# Patient Record
Sex: Female | Born: 1980 | Race: White | Hispanic: Yes | Marital: Married | Smoking: Never smoker
Health system: Southern US, Community
[De-identification: ages and names within clinical notes are randomized; demographics above are authoritative.]

## PROBLEM LIST (undated history)

## (undated) DIAGNOSIS — D696 Thrombocytopenia, unspecified: Secondary | ICD-10-CM

## (undated) DIAGNOSIS — F419 Anxiety disorder, unspecified: Secondary | ICD-10-CM

## (undated) DIAGNOSIS — M549 Dorsalgia, unspecified: Secondary | ICD-10-CM

## (undated) DIAGNOSIS — E785 Hyperlipidemia, unspecified: Secondary | ICD-10-CM

## (undated) HISTORY — DX: Hyperlipidemia, unspecified: E78.5

## (undated) HISTORY — DX: Dorsalgia, unspecified: M54.9

## (undated) HISTORY — DX: Anxiety disorder, unspecified: F41.9

## (undated) HISTORY — DX: Thrombocytopenia, unspecified: D69.6

---

## 2012-04-18 ENCOUNTER — Encounter (HOSPITAL_COMMUNITY): Payer: Self-pay | Admitting: *Deleted

## 2012-04-18 ENCOUNTER — Emergency Department (HOSPITAL_COMMUNITY)
Admission: EM | Admit: 2012-04-18 | Discharge: 2012-04-18 | Disposition: A | Discharge status: Home / Self Care | Payer: No Typology Code available for payment source | Attending: Emergency Medicine | Admitting: Emergency Medicine

## 2012-04-18 DIAGNOSIS — S0993XA Unspecified injury of face, initial encounter: Secondary | ICD-10-CM | POA: Insufficient documentation

## 2012-04-18 DIAGNOSIS — S79919A Unspecified injury of unspecified hip, initial encounter: Secondary | ICD-10-CM | POA: Insufficient documentation

## 2012-04-18 DIAGNOSIS — Y9389 Activity, other specified: Secondary | ICD-10-CM | POA: Insufficient documentation

## 2012-04-18 DIAGNOSIS — IMO0002 Reserved for concepts with insufficient information to code with codable children: Secondary | ICD-10-CM | POA: Insufficient documentation

## 2012-04-18 DIAGNOSIS — Y92410 Unspecified street and highway as the place of occurrence of the external cause: Secondary | ICD-10-CM | POA: Insufficient documentation

## 2012-04-18 DIAGNOSIS — S79929A Unspecified injury of unspecified thigh, initial encounter: Secondary | ICD-10-CM | POA: Insufficient documentation

## 2012-04-18 MED ORDER — MELOXICAM 15 MG PO TABS: 15.0000 mg | ORAL_TABLET | Freq: Every day | ORAL | Status: DC

## 2012-04-18 MED ORDER — ACETAMINOPHEN 500 MG PO TABS
1000.0000 mg | ORAL_TABLET | Freq: Once | ORAL | Status: AC
  Administered 2012-04-18: 1000 mg via ORAL
  Filled 2012-04-18: qty 2
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
## 2012-04-18 NOTE — ED Notes (Signed)
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
## 2012-04-19 NOTE — ED Provider Notes (Signed)
Medical screening examination/treatment/procedure(s) were performed by non-physician practitioner and as supervising physician I was immediately available for consultation/collaboration.   Novice Vrba III, MD 04/19/12 0113 

## 2013-06-04 ENCOUNTER — Emergency Department (INDEPENDENT_AMBULATORY_CARE_PROVIDER_SITE_OTHER): Payer: Self-pay

## 2013-06-04 ENCOUNTER — Emergency Department (INDEPENDENT_AMBULATORY_CARE_PROVIDER_SITE_OTHER)
Admission: EM | Admit: 2013-06-04 | Discharge: 2013-06-04 | Disposition: A | Discharge status: Home / Self Care | Payer: Self-pay | Source: Home / Self Care

## 2013-06-04 ENCOUNTER — Encounter (HOSPITAL_COMMUNITY): Payer: Self-pay | Admitting: Emergency Medicine

## 2013-06-04 DIAGNOSIS — W19XXXA Unspecified fall, initial encounter: Secondary | ICD-10-CM

## 2013-06-04 DIAGNOSIS — Y92009 Unspecified place in unspecified non-institutional (private) residence as the place of occurrence of the external cause: Secondary | ICD-10-CM

## 2013-06-04 DIAGNOSIS — S2239XA Fracture of one rib, unspecified side, initial encounter for closed fracture: Secondary | ICD-10-CM

## 2013-06-04 DIAGNOSIS — S20219A Contusion of unspecified front wall of thorax, initial encounter: Secondary | ICD-10-CM

## 2013-06-04 DIAGNOSIS — S2231XA Fracture of one rib, right side, initial encounter for closed fracture: Secondary | ICD-10-CM

## 2013-06-04 MED ORDER — DICLOFENAC POTASSIUM 50 MG PO TABS: 50.0000 mg | ORAL_TABLET | Freq: Three times a day (TID) | ORAL | Status: DC

## 2013-06-04 MED ORDER — HYDROCODONE-ACETAMINOPHEN 5-325 MG PO TABS
1.0000 | ORAL_TABLET | ORAL | Status: DC | PRN
Start: 2013-06-04 — End: 2014-12-12

## 2013-06-04 NOTE — ED Provider Notes (Signed)
CSN: [id number]     Arrival date & time 06/04/13  1054 History   First MD Initiated Contact with Patient 06/04/13 1159     Chief Complaint  Patient presents with  . Fall   (Consider location/radiation/quality/duration/timing/severity/associated sxs/prior Treatment) HPI Comments: 33 year old Hispanic female fell in bathroom as stated above. 2 days later she is complaining of persistent pain in the right anterolateral ribs. She denies shortness of breath but does have pain with deep inspiration. Denies neck pain. There is a small area just right of the mid thoracic spine with mild tenderness. Denies neurologic symptoms or focal deficits. She is ambulatory. She did not strike her head or lose consciousness or develop infusion or memory loss. Denies abdominal pain or vomiting. She has had transient nausea that occurred on the first day.   History reviewed. No pertinent past medical history. History reviewed. No pertinent past surgical history. No family history on file. History  Substance Use Topics  . Smoking status: Never Smoker   . Smokeless tobacco: Not on file  . Alcohol Use: No   OB History   Grav Para Term Preterm Abortions TAB SAB Ect Mult Living                 Review of Systems  Constitutional: Negative for fever, chills and activity change.  HENT: Negative.   Respiratory: Negative.  Negative for cough, choking, chest tightness and shortness of breath.   Cardiovascular: Negative.   Musculoskeletal: Negative for neck pain.       As per HPI  Skin: Negative for pallor and rash.       Contusion to the right mid anterolateral ribs.  Neurological: Negative.     Allergies  Review of patient's allergies indicates no known allergies.  Home Medications   Current Outpatient Rx  Name  Route  Sig  Dispense  Refill  . diclofenac (CATAFLAM) 50 MG tablet   Oral   Take 1 tablet (50 mg total) by mouth 3 (three) times daily. as needed for pain. Take with food   21 tablet   0   .  HYDROcodone-acetaminophen (NORCO/VICODIN) 5-325 MG per tablet   Oral   Take 1 tablet by mouth every 4 (four) hours as needed.   15 tablet   0   . meloxicam (MOBIC) 15 MG tablet   Oral   Take 1 tablet (15 mg total) by mouth daily. Take 1 daily with food.   10 tablet   0    BP 94/59  Pulse 50  Temp(Src) 98.6 F (37 C) (Oral)  Resp 18  SpO2 100% Physical Exam  Nursing note and vitals reviewed. Constitutional: She is oriented to person, place, and time. She appears well-developed and well-nourished. No distress.  HENT:  Head: Normocephalic and atraumatic.  Eyes: Conjunctivae and EOM are normal. Pupils are equal, round, and reactive to light.  Neck: Normal range of motion. Neck supple.  Cardiovascular: Normal rate, regular rhythm and normal heart sounds.   Pulmonary/Chest: Effort normal and breath sounds normal. No respiratory distress. She has no wheezes. She has no rales. She exhibits tenderness.  Tenderness to the right chest wall along the costal margin and superior to the fifth rib.  Abdominal: Soft. Bowel sounds are normal. She exhibits no distension and no mass. There is no tenderness. There is no rebound and no guarding.  Lymphadenopathy:    She has no cervical adenopathy.  Neurological: She is alert and oriented to person, place, and time. No cranial nerve  deficit.  Skin: Skin is warm and dry.  Psychiatric: She has a normal mood and affect.    ED Course  Procedures (including critical care time) Labs Review Labs Reviewed - No data to display Imaging Review Dg Ribs Unilateral W/chest Right  06/04/2013   CLINICAL DATA:  Pain with inspiration involving the anterior right lower rib for 3 days, history of fall  EXAM: RIGHT RIBS AND CHEST - 3+ VIEW  COMPARISON:  None.  FINDINGS: Normal cardiac silhouette and mediastinal contours. No focal parenchymal opacities. No pleural effusion pneumothorax. No evidence of edema.  There is a minimally displaced fracture involving the  anterior aspect of the right seventh rib. Regional soft tissues appear normal. No radiopaque foreign body.  IMPRESSION: 1. Minimally displaced fracture involving the anterior aspect of the right seventh rib. 2. No acute cardiopulmonary disease.   Electronically Signed   By: Simonne Come M.D.   On: 06/04/2013 13:35     MDM   1. Chest wall contusion   2. Fracture of rib of right side   3. Fall at home      norco 5 m #15 cataflam for pain prn Ice locally Need to get a PCP Return for problems, cough, fever, sh of breath.    Hayden Rasmussen, NP 06/04/13 1353

## 2013-06-04 NOTE — ED Notes (Signed)
Pt reports she fell on 06/02/13 while cleaning her bathroom Fell in the shower and hit right flank/rib cage; bruising visible Also reports pain at epigastric area; pain increases w/deep breaths Taking ibup w/no relief Alert w/no signs of acute distress.

## 2013-06-04 NOTE — Discharge Instructions (Signed)
Contusin en el trax  (Chest Contusion) Will need to obtain a primary care doctor for follow up and to get additional medication if needed.  Una contusin en el trax es un hematoma profundo en esa zona. Las contusiones son el resultado de una lesin que causa sangrado debajo de la piel. Puede causar un hematoma en la piel, los msculos o las costillas. La zona de la contusin puede ponerse [place], [place] o [place]. Las lesiones menores no causan Engineer, mining, Biomedical engineer las ms graves pueden presentar dolor e inflamacin durante un par de semanas. CAUSAS  La causa de la contusin generalmente es un golpe, un traumatismo o una fuerza directa ejercida sobre una zona del cuerpo.  SNTOMAS   Hinchazn y enrojecimiento en la zona lesionada.  Cambios de coloracin de la piel en esa zona.  Sensibilidad y Art therapist.  Dolor. DIAGNSTICO  El diagnstico puede hacerse realizando una historia clnica y un examen fsico. Podra ser necesario tomar una radiografa, tomografa computada (TC) o una resonancia magntica (RMN) para determinar si hubo lesiones asociadas, como por ejemplo huesos rotos (fracturas) o lesiones internas.  TRATAMIENTO  El mejor tratamiento para la contusin en el trax es el reposo, la aplicacin de hielo y compresas fras en la zona de la lesin. Podrn indicarle ejercicios de respiracin profunda para reducir el riesgo de neumona. Para calmar el dolor tambin podrn indicarle medicamentos de venta libre.  INSTRUCCIONES PARA EL CUIDADO EN EL HOGAR   Aplique hielo sobre la zona lesionada.  Ponga el hielo en una bolsa plstica.  Colquese una toalla entre la piel y la bolsa de hielo.  Deje el hielo durante 15 a 20 minutos, 3 a 4 veces por da.  Tome slo medicamentos de venta libre o recetados, segn las indicaciones del mdico. El mdico podr indicarle que evite tomar antiinflamatorios (aspirina, ibuprofeno y naproxeno) durante 48 horas ya que estos medicamentos pueden aumentar  los hematomas.  Haga que la zona lesionada repose.  Haga ejercicios de respiracin profunda segn las indicaciones de su mdico.  Si fuma, abandone el hbito.  No levante objetos ms pesados que 5 libras (2.3 kg.) durante 3 das o ms, si se lo indican. SOLICITE ATENCIN MDICA DE INMEDIATO SI:   El hematoma o la hinchazn aumentan.  Siente dolor que [place].  Tiene dificultad para respirar.  Se siente mareado, dbil o se desmaya.  Observa sangre en la orina.  Tose o vomita sangre.  La hinchazn o el dolor no se [employer]. ASEGRESE DE QUE:   Comprende estas instrucciones.  Controlar su enfermedad.  Solicitar ayuda de inmediato si no mejora o si empeora. Document Released: 11/20/2004 Document Revised: 11/05/2011 [facility] Patient Information 2014 [place], [place].  Kara Pacer de costilla  (Rib Fracture)  Una fractura de costilla es la ruptura de uno de los huesos que la forman. Las costillas son un grupo de huesos largos y curvos que rodean el pecho y estn adheridos a la columna vertebral. Protegen a los pulmones y a otros rganos que se encuentran en la cavidad torcica. La fractura o fisura de una costilla puede ser dolorosa pero no causa otros problemas. La mayora de las fracturas de costillas se curan por s mismas luego de un tiempo. Sin embargo, Producer, television/film/video a ser ms grave si se rompen varias costillas o si se desplazan y Education administrator. CAUSAS   Un golpe directo en el pecho. Por ejemplo, esto puede ocurrir [employer] prctica de deportes de [place], un  accidente automovilstico o una cada sobre un [place]objeto duro.  Los movimientos repetitivos con una gran fuerza, como al lanzar una pelota en el bisbol, o tener episodios de tos intensos. SNTOMAS   Dolor al respirar o al toser.  Dolor cuando alguien presiona la zona lesionada. DIAGNSTICO  El Office Depotmdico le har un examen fsico. Le indicar diferentes estudios por imgenes para  confirmar el diagnstico y buscar lesiones relacionadas. Estos estudios incluyen radiografas de trax, tomografa computada (TC), resonancia magntica (MRI) o gammagrafa sea.  TRATAMIENTO  La mayora de las fracturas de costillas se curan por s mismas en 1 a 3 meses. Los perodos de curacin ms prolongados generalmente se asocian a tos continua o a otras actividades que agravan el problema. Durante el perodo de curacin es muy importante el control del dolor. Generalmente se recetan medicamentos para Human resources officercontrolar el dolor. Ser necesaria la hospitalizacin o una ciruga si hay lesiones ms graves, como las fracturas de mltiples costillas o aquellas en las que las costillas se desplazan.  INSTRUCCIONES PARA EL CUIDADO EN EL HOGAR   Evite las actividades extenuantes y toda [place] Grenadinesactividad o movimiento que le cause dolor. Realice las actividades con cuidado y evite golpearse las costillas lesionadas.  Reanude la actividad sexual cuando le indique su mdico.  Tome slo medicamentos de venta libre o recetados, segn las indicaciones del mdico. No tome otros medicamentos sin Science writerconsultar a su mdico.  Aplique hielo en la zona Cox Communicationslesionada durante los primeros 1 a 2 das despus de haber recibido tratamiento o segn las indicaciones de su mdico. La aplicacin del hielo ayuda a reducir la inflamacin y Chief Technology Officerel dolor.  Ponga el hielo en una bolsa plstica.  Colquese una toalla entre la piel y la bolsa de hielo.   Deje el hielo durante 15 a 20 minutos, cada 2 horas mientras se encuentre despierto.  Haga ejercicios de Arts development officerrespiracin como le indique su mdico. Esto ayudar a evitar la neumona, que es una complicacin frecuente en las fracturas de [place]costilla. El mdico le indicar que:  Haga respiraciones profundas varias veces al da.  Trate de toser Dover Corporationvarias veces al da, sosteniendo el rea lesionada con Running Y Ranchuna almohada.  Use un dispositivo llamado espirmetro incentivo para realizar respiraciones profundas varias  veces por da.  Beba suficiente lquido para Photographermantener la orina clara o de color amarillo plido. Esto le ayudar a Chief Strategy Officerevitar el estreimiento.   No use cinturones ni sujetadores. Esta limitacin de la respiracin puede causar neumona.  SOLICITE ATENCIN MDICA DE INMEDIATO SI:   Tiene fiebre.  Tiene dificultad para respirar o Company secretaryle falta el aire.   Tiene tos continua o elimina moco espeso o sanguinolento.  Tiene Programme researcher, broadcasting/film/videomalestar estomacal (nuseas), devuelve (vomita), o tiene dolor abdominal.   El dolor aumenta y no se alivia con los medicamentos.  ASEGRESE DE QUE:   Comprende estas instrucciones.  Controlar su enfermedad.  Recibir ayuda de inmediato si no mejora o si empeora. Document Released: 11/20/2004 Document Revised: 10/13/2012 [facility] Patient Information 2014 [place], [place].  Contusin en las costillas  (Rib Contusion)  Una contusin en las costillas (hematoma) sucede como consecuencia de un golpe en el trax o una cada contra un objeto duro. Generalmente la mejora llega luego de un par de semanas. Si hoy le tomaron radiografas y no hallaron fracturas (ruptura de los [place]huesos), se realiza el diagnstico de contusin. Sin embargo, Chiropodistmuchas veces la fractura de las costillas no se observa hasta que pasan [place]algunos das, o puede descubrirse mucho ms tarde, con una radiografa de  rutina en la que se vern signos de curacin. Si esto le ocurre, no significa que hubo un error al observar las radiografas, sino simplemente que no se manifest en las primeras placas. En general, el diagnstico precoz no modifica el tratamiento. INSTRUCCIONES PARA EL CUIDADO DOMICILIARIO  Evite las actividades extenuantes. Realice las tareas con cuidado y evite golpearse las costillas lesionadas. Trate en lo posible de no realizar actividades que presionen las costillas lesionadas y Freight forwarder.  Durante uno o [place], [place] ser beneficioso colocar una bolsa con hielo cada veinte minutos mientras  est despierto. Coloque el hielo en una bolsa plstica y ponga una toalla entre la bolsa y la piel.  Consuma una dieta normal y bien balanceada. Beba gran cantidad de lquidos para evitar la constipacin.  Respire profundo varias veces al da para [employer] pulmones libres de infecciones. Trate de toser Engineer, maintenance. Sostenga el rea lesionada con una almohada mientras tose para Engineer, materials. Expectorar puede ayudarlo a prevenir la neumona.  Utilice un sostn de costillas o faja SLO SI se lo ha indicado el mdico. Si los [place], Photographer los ejercicios de respiracin segn se le haya indicado. Si no se [employer], los cinturones o fajas restringen la respiracin y pueden causar una neumona.  Solo tome medicamentos que se pueden comprar sin receta o recetados para Chief Technology Officer, Dentist o fiebre, como le indica el mdico. SOLICITE ATENCIN MDICA SI:  Daphane Shepherd o su nio tienen una temperatura oral de ms de 38,9 C (102 F).  El beb tiene ms de 3 meses y su temperatura rectal es de 100.5 F (38.1 C) o ms durante ms de 1 da.  Aparece tos continua, asociada a esputo espeso o sanguinolento. SOLICITE ATENCIN MDICA DE INMEDIATO SI:  Presenta dificultades respiratorias.  Siente nuseas (ganas de vomitar), tiene vmitos o dolor abdominal (en el vientre).  El dolor empeora y no puede controlarlo con los medicamentos o hay un cambio en la ubicacin del dolor.  Comienza a sentir sudoraciones o Chief Technology Officer se [employer], la [place] o los hombros, o siente mareos o se [place].  Usted o su nio tienen una temperatura oral de ms de 38,9 C (102 F) y no puede controlarla con medicamentos.  Su beb tiene ms de 3 meses y su temperatura rectal es de 102 F (38.9 C) o ms.  Su beb tiene 3 meses o menos y su temperatura rectal es de 100.4 F (38 C) o ms. EST SEGURO QUE:   Comprende las instrucciones para el alta mdica.  Controlar su  enfermedad.  Solicitar atencin mdica de inmediato segn las indicaciones. Document Released: 11/20/2004 Document Revised: 05/05/2011 [facility] Patient Information 2014 [place], [place].

## 2013-06-07 NOTE — ED Provider Notes (Signed)
Medical screening examination/treatment/procedure(s) were performed by a resident physician or non-physician practitioner and as the supervising physician I was immediately available for consultation/collaboration.  Kyshon Tolliver, MD    Christeen Lai S Orlean Holtrop, MD 06/07/13 1639 

## 2013-08-15 ENCOUNTER — Other Ambulatory Visit (HOSPITAL_COMMUNITY)
Admission: RE | Admit: 2013-08-15 | Discharge: 2013-08-15 | Disposition: A | Discharge status: Home / Self Care | Payer: No Typology Code available for payment source | Source: Ambulatory Visit | Attending: Internal Medicine | Admitting: Internal Medicine

## 2013-08-15 ENCOUNTER — Ambulatory Visit: Payer: No Typology Code available for payment source | Attending: Internal Medicine | Admitting: Internal Medicine

## 2013-08-15 ENCOUNTER — Encounter: Payer: Self-pay | Admitting: Internal Medicine

## 2013-08-15 VITALS — BP 119/75 | HR 69 | Temp 98.3°F | Resp 16 | Ht 61.0 in | Wt 140.0 lb

## 2013-08-15 DIAGNOSIS — N760 Acute vaginitis: Secondary | ICD-10-CM | POA: Insufficient documentation

## 2013-08-15 DIAGNOSIS — N898 Other specified noninflammatory disorders of vagina: Secondary | ICD-10-CM | POA: Insufficient documentation

## 2013-08-15 DIAGNOSIS — Z113 Encounter for screening for infections with a predominantly sexual mode of transmission: Secondary | ICD-10-CM | POA: Insufficient documentation

## 2013-08-15 DIAGNOSIS — S2231XA Fracture of one rib, right side, initial encounter for closed fracture: Secondary | ICD-10-CM

## 2013-08-15 DIAGNOSIS — W010XXA Fall on same level from slipping, tripping and stumbling without subsequent striking against object, initial encounter: Secondary | ICD-10-CM | POA: Insufficient documentation

## 2013-08-15 DIAGNOSIS — R109 Unspecified abdominal pain: Secondary | ICD-10-CM | POA: Insufficient documentation

## 2013-08-15 DIAGNOSIS — S2239XA Fracture of one rib, unspecified side, initial encounter for closed fracture: Secondary | ICD-10-CM | POA: Insufficient documentation

## 2013-08-15 DIAGNOSIS — Y92009 Unspecified place in unspecified non-institutional (private) residence as the place of occurrence of the external cause: Secondary | ICD-10-CM | POA: Insufficient documentation

## 2013-08-15 LAB — CBC WITH DIFFERENTIAL/PLATELET
BASOS ABS: 0 10*3/uL (ref 0.0–0.1)
BASOS PCT: 0 % (ref 0–1)
EOS ABS: 0.1 10*3/uL (ref 0.0–0.7)
EOS PCT: 2 % (ref 0–5)
HEMATOCRIT: 35.1 % — AB (ref 36.0–46.0)
Hemoglobin: 11.5 g/dL — ABNORMAL LOW (ref 12.0–15.0)
Lymphocytes Relative: 40 % (ref 12–46)
Lymphs Abs: 2.5 10*3/uL (ref 0.7–4.0)
MCH: 27.5 pg (ref 26.0–34.0)
MCHC: 32.8 g/dL (ref 30.0–36.0)
MCV: 84 fL (ref 78.0–100.0)
MONO ABS: 0.3 10*3/uL (ref 0.1–1.0)
Monocytes Relative: 5 % (ref 3–12)
NEUTROS ABS: 3.3 10*3/uL (ref 1.7–7.7)
Neutrophils Relative %: 53 % (ref 43–77)
Platelets: 210 10*3/uL (ref 150–400)
RBC: 4.18 MIL/uL (ref 3.87–5.11)
RDW: 13.9 % (ref 11.5–15.5)
WBC: 6.3 10*3/uL (ref 4.0–10.5)

## 2013-08-15 LAB — POCT URINALYSIS DIPSTICK
Bilirubin, UA: NEGATIVE
Glucose, UA: NEGATIVE
KETONES UA: NEGATIVE
LEUKOCYTES UA: NEGATIVE
Nitrite, UA: NEGATIVE
PH UA: 7
Spec Grav, UA: 1.025
Urobilinogen, UA: 1

## 2013-08-15 NOTE — Progress Notes (Signed)
Pt here to establish care s/p fall at home 06/04/13 s/ right rib fracture diagnosed in the urgent care Pt diagnosed with right chest wall contusion with prescribed medication Pt states she comes in for right side pain radiating epigastric area intermit since fall Denies sob or chest pain Pt also c/o lower abdominal pain radiating to lower back x 3 dys Afebrile,no nausea,vomiting or chills reported Spanish interpretor present Last pap smear 6 mnths ago at health department Pt has Implanon

## 2013-08-15 NOTE — Progress Notes (Signed)
Patient ID: Julia Roy, female   DOB: [birth year], 33 y.o.   MRN: [id number]   Julia Roy, is a 33 y.o. female  WGN:[id number]  HQI:[id number]  DOB - [birth year]  CC:  Chief Complaint  Patient presents with  . Establish Care  . Abdominal Pain  . Rib Injury       HPI: Julia Roy is a 33 y.o. female here today to establish medical care. Patient has no significant past medical history. She fell in the bathroom recently around April and was found in the ER to have fractured ribs on the right. She was given pain medication and wants to followup with us for a repeat chest x-ray. Her complaint today include abdominal pain, no nausea or vomiting, no diarrhea or constipation, no abdominal distention. This pain has been on and off for about 2 months mostly on the right lower abdomen. She recently noticed some vagina discharge mostly whitish in color no unusual odor. She is sexually active with husband, she has 3 children. She does not smoke cigarette she does not drink alcohol. She reports no chest pain except for discomfort around the fractures on deep breathing. No cough. Patient has No headache, No chest pain, No abdominal pain - No Nausea, No new weakness tingling or numbness, No Cough - SOB.  No Known Allergies History reviewed. No pertinent past medical history. Current Outpatient Prescriptions on File Prior to Visit  Medication Sig Dispense Refill  . diclofenac (CATAFLAM) 50 MG tablet Take 1 tablet (50 mg total) by mouth 3 (three) times daily. as needed for pain. Take with food  21 tablet  0  . HYDROcodone-acetaminophen (NORCO/VICODIN) 5-325 MG per tablet Take 1 tablet by mouth every 4 (four) hours as needed.  15 tablet  0  . meloxicam (MOBIC) 15 MG tablet Take 1 tablet (15 mg total) by mouth daily. Take 1 daily with food.  10 tablet  0   No current facility-administered medications on file prior to visit.   History reviewed. No pertinent family history. History   Social  History  . Marital Status: Married    Spouse Name: N/A    Number of Children: N/A  . Years of Education: N/A   Occupational History  . Not on file.   Social History Main Topics  . Smoking status: Never Smoker   . Smokeless tobacco: Not on file  . Alcohol Use: No  . Drug Use: No  . Sexual Activity: Yes    Birth Control/ Protection: Implant   Other Topics Concern  . Not on file   Social History Narrative  . No narrative on file    Review of Systems: Constitutional: Negative for fever, chills, diaphoresis, activity change, appetite change and fatigue. HENT: Negative for ear pain, nosebleeds, congestion, facial swelling, rhinorrhea, neck pain, neck stiffness and ear discharge.  Eyes: Negative for pain, discharge, redness, itching and visual disturbance. Respiratory: Negative for cough, choking, chest tightness, shortness of breath, wheezing and stridor.  Cardiovascular: Negative for chest pain, palpitations and leg swelling. Gastrointestinal: Negative for abdominal distention. Genitourinary: Negative for dysuria, urgency, frequency, hematuria, flank pain, decreased urine volume, difficulty urinating and dyspareunia.  Musculoskeletal: Negative for back pain, joint swelling, arthralgia and gait problem. Neurological: Negative for dizziness, tremors, seizures, syncope, facial asymmetry, speech difficulty, weakness, light-headedness, numbness and headaches.  Hematological: Negative for adenopathy. Does not bruise/bleed easily. Psychiatric/Behavioral: Negative for hallucinations, behavioral problems, confusion, dysphoric mood, decreased concentration and agitation.    Objective:   Filed Vitals:   08/15/13  1713  BP: 119/75  Pulse: 69  Temp: 98.3 F (36.8 C)  Resp: 16    Physical Exam: Constitutional: Patient appears well-developed and well-nourished. No distress. HENT: Normocephalic, atraumatic, External right and left ear normal. Oropharynx is clear and moist.  Eyes:  Conjunctivae and EOM are normal. PERRLA, no scleral icterus. Neck: Normal ROM. Neck supple. No JVD. No tracheal deviation. No thyromegaly. CVS: RRR, S1/S2 +, no murmurs, no gallops, no carotid bruit.  Pulmonary: Effort and breath sounds normal, no stridor, rhonchi, wheezes, rales.  Abdominal: Soft. BS +, no distension, tenderness around the right iliac fossa, no rebound  Musculoskeletal: Normal range of motion. No edema and no tenderness.  Lymphadenopathy: No lymphadenopathy noted, cervical, inguinal or axillary Neuro: Alert. Normal reflexes, muscle tone coordination. No cranial nerve deficit. Skin: Skin is warm and dry. No rash noted. Not diaphoretic. No erythema. No pallor. Psychiatric: Normal mood and affect. Behavior, judgment, thought content normal. Pelvic examination: Normal female external genitalia, central cervix, copious vaginal discharge, no unusual odor, negative cervical motion tenderness No results found for this basename: WBC, HGB, HCT, MCV, PLT   No results found for this basename: CREATININE, BUN, NA, K, CL, CO2    No results found for this basename: HGBA1C   Lipid Panel  No results found for this basename: chol, trig, hdl, cholhdl, vldl, ldlcalc       Assessment and plan:   1. Abdominal pain, unspecified site  - Urinalysis Dipstick: negative for infection  - US Pelvis Complete; Future - US Transvaginal Non-OB; Future - CBC with Differential - COMPLETE METABOLIC PANEL WITH GFR - POCT glycosylated hemoglobin (Hb A1C) - Lipid panel - TSH  2. Rib fracture, right, closed, initial encounter  - DG Chest 2 View; Future  3. Vaginal discharge  - Cervicovaginal ancillary only  Patient was extensively counseled on nutrition and exercise Interpreter was used to communicate directly with patient for the entire encounter including providing detailed patient instructions.   Return in about 6 months (around 02/14/2014), or if symptoms worsen or fail to improve, for  Follow up Pain and comorbidities, Abdominal Pain.  The patient was given clear instructions to go to ER or return to medical center if symptoms don't improve, worsen or new problems develop. The patient verbalized understanding. The patient was told to call to get lab results if they haven't heard anything in the next week.     This note has been created with Education officer, environmentalDragon speech recognition software and smart phrase technology. Any transcriptional errors are unintentional.    Jeanann LewandowskyJEGEDE, Karima Carrell, MD, MHA, FACP, FAAP [facility] [place], [place] [phone]   08/15/2013, 5:38 PM

## 2013-08-16 ENCOUNTER — Telehealth: Payer: Self-pay | Admitting: Emergency Medicine

## 2013-08-16 LAB — COMPLETE METABOLIC PANEL WITH GFR
ALK PHOS: 53 U/L (ref 39–117)
ALT: 10 U/L (ref 0–35)
AST: 15 U/L (ref 0–37)
Albumin: 4.1 g/dL (ref 3.5–5.2)
BILIRUBIN TOTAL: 0.2 mg/dL (ref 0.2–1.2)
BUN: 10 mg/dL (ref 6–23)
CO2: 28 mEq/L (ref 19–32)
CREATININE: 0.61 mg/dL (ref 0.50–1.10)
Calcium: 9 mg/dL (ref 8.4–10.5)
Chloride: 107 mEq/L (ref 96–112)
GFR, Est African American: >89 mL/min
GFR, Est Non African American: >89 mL/min
Glucose, Bld: 75 mg/dL (ref 70–99)
POTASSIUM: 3.9 meq/L (ref 3.5–5.3)
Sodium: 138 mEq/L (ref 135–145)
Total Protein: 6.6 g/dL (ref 6.0–8.3)

## 2013-08-16 LAB — LIPID PANEL
CHOL/HDL RATIO: 2.5 ratio
CHOLESTEROL: 130 mg/dL (ref 0–200)
HDL: 53 mg/dL (ref >39)
LDL Cholesterol: 55 mg/dL (ref 0–99)
TRIGLYCERIDES: 108 mg/dL (ref <150)
VLDL: 22 mg/dL (ref 0–40)

## 2013-08-16 LAB — TSH: TSH: 2.006 u[IU]/mL (ref 0.350–4.500)

## 2013-08-16 NOTE — Telephone Encounter (Signed)
Pt given scheduled [facility]/s [facility] for [facility]. Pt given radiology number to reschedule appt for later date [employer] interpretor line used for communication

## 2013-08-19 ENCOUNTER — Ambulatory Visit (HOSPITAL_COMMUNITY): Payer: No Typology Code available for payment source

## 2013-08-23 ENCOUNTER — Ambulatory Visit (HOSPITAL_COMMUNITY)
Admission: RE | Admit: 2013-08-23 | Discharge: 2013-08-23 | Disposition: A | Discharge status: Home / Self Care | Payer: No Typology Code available for payment source | Source: Ambulatory Visit | Attending: Internal Medicine | Admitting: Internal Medicine

## 2013-08-23 ENCOUNTER — Ambulatory Visit (HOSPITAL_COMMUNITY): Payer: No Typology Code available for payment source

## 2013-08-23 DIAGNOSIS — S2231XA Fracture of one rib, right side, initial encounter for closed fracture: Secondary | ICD-10-CM

## 2013-08-23 DIAGNOSIS — R109 Unspecified abdominal pain: Secondary | ICD-10-CM

## 2013-08-23 DIAGNOSIS — N83209 Unspecified ovarian cyst, unspecified side: Secondary | ICD-10-CM | POA: Insufficient documentation

## 2013-08-25 ENCOUNTER — Telehealth: Payer: Self-pay | Admitting: *Deleted

## 2013-08-25 ENCOUNTER — Telehealth: Payer: Self-pay

## 2013-08-25 NOTE — Telephone Encounter (Signed)
Pt is aware of her US results.

## 2013-08-25 NOTE — Telephone Encounter (Signed)
Message copied by Raynelle CharyWINFREE, Jawaun Celmer R on Thu Aug 25, 2013 10:22 AM ------      Message from: Jeanann LewandowskyJEGEDE, OLUGBEMIGA E      Created: Wed Aug 24, 2013 10:23 PM       Same as before ------

## 2013-08-25 NOTE — Telephone Encounter (Signed)
Interpreter line used Patient is aware of her ultra sound report

## 2013-08-25 NOTE — Telephone Encounter (Signed)
Message copied by Lestine MountJUAREZ, Chizaram Latino L on Thu Aug 25, 2013  3:58 PM ------      Message from: Jeanann LewandowskyJEGEDE, OLUGBEMIGA E      Created: Wed Aug 24, 2013 10:23 PM       Please inform patient that her ultrasound report shows simple ovarian cyst on the right, according to the report, it is benign. Recommend followup ultrasound every year to monitor this. The uterus is normal as well as the left ovary, no mass or fluid otherwise ------

## 2013-09-05 ENCOUNTER — Ambulatory Visit: Payer: No Typology Code available for payment source

## 2013-09-07 ENCOUNTER — Telehealth: Payer: Self-pay | Admitting: Emergency Medicine

## 2013-09-07 MED ORDER — METRONIDAZOLE 500 MG PO TABS: 500.0000 mg | ORAL_TABLET | Freq: Two times a day (BID) | ORAL | Status: DC

## 2013-09-07 NOTE — Telephone Encounter (Signed)
Message copied by Darlis LoanSMITH, JILL D on Wed Sep 07, 2013  4:27 PM ------      Message from: Quentin AngstJEGEDE, OLUGBEMIGA E      Created: Tue Sep 06, 2013  6:11 PM       Please inform patient that her vagina swab results came back positive for bacterial vaginosis, this is not a sexually transmitted disease, but is treatable with antibiotics            Please call in antibiotics metronidazole 500 mg tablet by mouth twice a day for 7 days       ------

## 2013-09-07 NOTE — Telephone Encounter (Signed)
Only number listed invalid. If pt calls please transfer call to me.

## 2014-03-31 ENCOUNTER — Ambulatory Visit: Payer: Self-pay | Attending: Internal Medicine

## 2014-06-16 ENCOUNTER — Telehealth: Payer: Self-pay | Admitting: *Deleted

## 2014-06-16 NOTE — Telephone Encounter (Signed)
Returned Forensic scientistN Olivia Blanchard's phone call regarding this patient needing an appointment for head pain.  Left HIPPA compliant message for her to return my call.  Patient has not been seen here since June 2015 and [facility] at that time that was to expire in October 2015.  In the original message, Ms. Teodoro KilBlanchard said the patient had told her that she had filled out all necessary paperwork for the discount card but she never heard back from us?

## 2014-07-12 ENCOUNTER — Ambulatory Visit: Payer: Self-pay | Attending: Internal Medicine

## 2014-08-16 ENCOUNTER — Encounter: Payer: Self-pay | Admitting: Internal Medicine

## 2014-08-16 ENCOUNTER — Ambulatory Visit: Payer: Self-pay | Attending: Internal Medicine | Admitting: Internal Medicine

## 2014-08-16 VITALS — BP 107/69 | HR 60 | Resp 16 | Wt 154.2 lb

## 2014-08-16 DIAGNOSIS — F419 Anxiety disorder, unspecified: Secondary | ICD-10-CM

## 2014-08-16 DIAGNOSIS — H538 Other visual disturbances: Secondary | ICD-10-CM

## 2014-08-16 LAB — CBC
HEMATOCRIT: 38.2 % (ref 36.0–46.0)
HEMOGLOBIN: 12.2 g/dL (ref 12.0–15.0)
MCH: 26.8 pg (ref 26.0–34.0)
MCHC: 31.9 g/dL (ref 30.0–36.0)
MCV: 84 fL (ref 78.0–100.0)
MPV: 11.6 fL (ref 8.6–12.4)
PLATELETS: 229 10*3/uL (ref 150–400)
RBC: 4.55 MIL/uL (ref 3.87–5.11)
RDW: 13.8 % (ref 11.5–15.5)
WBC: 6.6 10*3/uL (ref 4.0–10.5)

## 2014-08-16 LAB — TSH: TSH: 1.335 u[IU]/mL (ref 0.350–4.500)

## 2014-08-16 MED ORDER — CLONAZEPAM 0.5 MG PO TABS: 0.5000 mg | ORAL_TABLET | Freq: Two times a day (BID) | ORAL | Status: DC

## 2014-08-16 NOTE — Progress Notes (Signed)
Patient ID: Julia Roy, female   DOB: [birth year], 34 y.o.   MRN: [id number]  CC: f/u  HPI: Julia Roy is a 34 y.o. female here today for a follow up visit.  Patient has no past medical history.  Patient reports that she has been a pressure and a sensation of something cold on her head that happens intermittently for the past 3 months. Usually has this sensation several times per day.  During these episodes she has symptoms of blurred vision and fatigued. No nausea or vomiting. She has confusion that has caused her to lose her job recently due to forgetting to do simple task. Some facial tingling. Denies anxiety and depression. She is a single mom with 4 kids and a recent loss of job. Normal menstrual periods, has birth control implant.     No Known Allergies No past medical history on file. Current Outpatient Prescriptions on File Prior to Visit  Medication Sig Dispense Refill  . diclofenac (CATAFLAM) 50 MG tablet Take 1 tablet (50 mg total) by mouth 3 (three) times daily. as needed for pain. Take with food (Patient not taking: Reported on 08/16/2014) 21 tablet 0  . HYDROcodone-acetaminophen (NORCO/VICODIN) 5-325 MG per tablet Take 1 tablet by mouth every 4 (four) hours as needed. (Patient not taking: Reported on 08/16/2014) 15 tablet 0  . meloxicam (MOBIC) 15 MG tablet Take 1 tablet (15 mg total) by mouth daily. Take 1 daily with food. (Patient not taking: Reported on 08/16/2014) 10 tablet 0  . metroNIDAZOLE (FLAGYL) 500 MG tablet Take 1 tablet (500 mg total) by mouth 2 (two) times daily. (Patient not taking: Reported on 08/16/2014) 14 tablet 0   No current facility-administered medications on file prior to visit.   Family History  Problem Relation Age of Onset  . Stroke Mother   . Vision loss Mother    History   Social History  . Marital Status: Married    Spouse Name: N/A  . Number of Children: N/A  . Years of Education: N/A   Occupational History  . Not on file.   Social  History Main Topics  . Smoking status: Never Smoker   . Smokeless tobacco: Not on file  . Alcohol Use: No  . Drug Use: No  . Sexual Activity: Yes    Birth Control/ Protection: Implant   Other Topics Concern  . Not on file   Social History Narrative    Review of Systems: See HPI    Objective:   Filed Vitals:   08/16/14 1526  BP: 107/69  Pulse: 60  Resp: 16    Physical Exam  Constitutional: She is oriented to person, place, and time.  Cardiovascular: Normal rate, regular rhythm and normal heart sounds.   Pulmonary/Chest: Effort normal and breath sounds normal.  Neurological: She is alert and oriented to person, place, and time. No cranial nerve deficit.  Skin: Skin is warm and dry.     Lab Results  Component Value Date   WBC 6.3 08/15/2013   HGB 11.5* 08/15/2013   HCT 35.1* 08/15/2013   MCV 84.0 08/15/2013   PLT 210 08/15/2013   Lab Results  Component Value Date   CREATININE 0.61 08/15/2013   BUN 10 08/15/2013   NA 138 08/15/2013   K 3.9 08/15/2013   CL 107 08/15/2013   CO2 28 08/15/2013    No results found for: HGBA1C Lipid Panel     Component Value Date/Time   CHOL 130 08/15/2013 1745   TRIG 108  08/15/2013 1745   HDL 53 08/15/2013 1745   CHOLHDL 2.5 08/15/2013 1745   VLDL 22 08/15/2013 1745   LDLCALC 55 08/15/2013 1745       Assessment and plan:   Chenda was seen today for new patient.  Diagnoses and all orders for this visit:  Anxiety Orders: -     Vitamin D, 25-hydroxy -     CBC -     TSH -     Begin clonazePAM (KLONOPIN) 0.5 MG tablet; Take 1 tablet (0.5 mg total) by mouth 2 (two) times daily. For 2 weeks. After that use only as needed Patient has several random symptoms that are all suggestive of anxiety. I will have patient to use klonopin as needed for one month and have her to f/u in 4 weeks to see if her symptoms have improved.   Blurred vision Orders: -     Visual acuity screening Normal vision  Due to language barrier,  an interpreter was present during the history-taking and subsequent discussion (and for part of the physical exam) with this patient.  Return in about 4 weeks (around 09/13/2014) for anxiety.       Holland Commons, NP-C [facility] [phone] 08/16/2014, 3:41 PM

## 2014-08-16 NOTE — Progress Notes (Signed)
  New patient here to established care. Pt c/o head pressure and being confused at times and when she feels like this she has blurred vision. Pt states her right hand middle finger has some tingling sometimes.

## 2014-08-16 NOTE — Patient Instructions (Signed)
Generalized Anxiety Disorder Generalized anxiety disorder (GAD) is a mental disorder. It interferes with life functions, including relationships, work, and school. GAD is different from normal anxiety, which everyone experiences at some point in their lives in response to specific life events and activities. Normal anxiety actually helps us prepare for and get through these life events and activities. Normal anxiety goes away after the event or activity is over.  GAD causes anxiety that is not necessarily related to specific events or activities. It also causes excess anxiety in proportion to specific events or activities. The anxiety associated with GAD is also difficult to control. GAD can vary from mild to severe. People with severe GAD can have intense waves of anxiety with physical symptoms (panic attacks).  SYMPTOMS The anxiety and worry associated with GAD are difficult to control. This anxiety and worry are related to many life events and activities and also occur more days than not for 6 months or longer. People with GAD also have three or more of the following symptoms (one or more in children):  Restlessness.   Fatigue.  Difficulty concentrating.   Irritability.  Muscle tension.  Difficulty sleeping or unsatisfying sleep. DIAGNOSIS GAD is diagnosed through an assessment by your health care provider. Your health care provider will ask you questions aboutyour mood,physical symptoms, and events in your life. Your health care provider may ask you about your medical history and use of alcohol or drugs, including prescription medicines. Your health care provider may also do a physical exam and blood tests. Certain medical conditions and the use of certain substances can cause symptoms similar to those associated with GAD. Your health care provider may refer you to a mental health specialist for further evaluation. TREATMENT The following therapies are usually used to treat GAD:    Medication. Antidepressant medication usually is prescribed for long-term daily control. Antianxiety medicines may be added in severe cases, especially when panic attacks occur.   Talk therapy (psychotherapy). Certain types of talk therapy can be helpful in treating GAD by providing support, education, and guidance. A form of talk therapy called cognitive behavioral therapy can teach you healthy ways to think about and react to daily life events and activities.  Stress managementtechniques. These include yoga, meditation, and exercise and can be very helpful when they are practiced regularly. A mental health specialist can help determine which treatment is best for you. Some people see improvement with one therapy. However, other people require a combination of therapies. Document Released: 06/07/2012 Document Revised: 06/27/2013 Document Reviewed: 06/07/2012 ExitCare Patient Information 2015 [employer]. This information is not intended to replace advice given to you by your health care provider. Make sure you discuss any questions you have with your health care provider.  

## 2014-08-17 LAB — VITAMIN D 25 HYDROXY (VIT D DEFICIENCY, FRACTURES): Vit D, 25-Hydroxy: 24 ng/mL — ABNORMAL LOW (ref 30–100)

## 2014-09-14 ENCOUNTER — Encounter: Payer: Self-pay | Admitting: Internal Medicine

## 2014-09-14 ENCOUNTER — Ambulatory Visit: Payer: Self-pay | Attending: Internal Medicine | Admitting: Internal Medicine

## 2014-09-14 VITALS — BP 110/73 | HR 65 | Temp 98.0°F | Resp 16 | Ht 62.0 in | Wt 155.4 lb

## 2014-09-14 DIAGNOSIS — Z79899 Other long term (current) drug therapy: Secondary | ICD-10-CM | POA: Insufficient documentation

## 2014-09-14 DIAGNOSIS — F419 Anxiety disorder, unspecified: Secondary | ICD-10-CM | POA: Insufficient documentation

## 2014-09-14 MED ORDER — ESCITALOPRAM OXALATE 10 MG PO TABS: 10.0000 mg | ORAL_TABLET | Freq: Every day | ORAL | Status: DC

## 2014-09-14 NOTE — Patient Instructions (Signed)
Trastorno de ansiedad generalizada (Generalized Anxiety Disorder) El trastorno de ansiedad generalizada es un trastorno mental. Interfiere en las funciones vitales, incluyendo las relaciones, el trabajo y la escuela.  Es diferente de la ansiedad normal que todas las personas experimentan en algn momento de su vida en respuesta a sucesos y actividades especficas. En verdad, la ansiedad normal nos ayuda a prepararnos y atravesar estos acontecimientos y actividades de la vida. La ansiedad normal desaparece despus de que el evento o la actividad ha finalizado.  El trastorno de ansiedad generalizada no est necesariamente relacionada con eventos o actividades especficas. Tambin causa un exceso de ansiedad en proporcin a sucesos o actividades especficas. En este trastorno la ansiedad es difcil de controlar. Los sntomas pueden variar de leves a muy graves. Las personas que sufren de trastorno de ansiedad generalizada pueden tener intensas olas de ansiedad con sntomas fsicos (ataques de pnico).  SNTOMAS  La ansiedad y la preocupacin asociada a este trastorno son difciles de controlar. Esta ansiedad y la preocupacin estn relacionados con muchos eventos de la vida y sus actividades y tambin ocurre durante ms das de los que no ocurre, durante 6 meses o ms. Las personas que la sufren pueden tener tres o ms de los siguientes sntomas (uno o ms en los nios):   Agitacin   Fatiga.  Dificultades de concentracin.   Irritabilidad.  Tensin muscular  Dificultad para dormirse o sueo poco satisfactorio. DIAGNSTICO  Se diagnostica a travs de una evaluacin realizada por el mdico. El mdico le har preguntas acerca de su estado de nimo, sntomas fsicos y sucesos de su vida. Le har preguntas sobre su historia clnica, el consumo de alcohol o drogas, incluyendo los medicamentos recetados. Tambin le har un examen fsico e indicar anlisis de sangre. Ciertas enfermedades y el uso de  determinadas sustancias pueden causar sntomas similares a este trastorno. Su mdico lo puede derivar a un especialista en salud mental para una evaluacin ms profunda..  TRATAMIENTO  Las terapias siguientes se utilizan en el tratamiento de este trastorno:   Medicamentos - Se recetan antidepresivos para el control diario a largo plazo. Pueden indicarse tambin medicamentos para combatir la ansiedad en los casos graves, especialmente cuando ocurren ataques de pnico.   Terapia conversada (psicoterapia) Ciertos tipos de psicoterapia pueden ser tiles en el tratamiento del trastorno de ansiedad generalizada, proporcionando apoyo, educacin y orientacin. Una forma de psicoterapia llamada terapia cognitivo-conductual puede ensearle formas saludables de pensar y reaccionar a los eventos y actividades de la vida diaria.  Tcnicasde manejo del estrs- Estas tcnicas incluyen el yoga, la meditacin y el ejercicio y pueden ser muy tiles cuando se practican con regularidad. Un especialista en salud mental puede ayudar a determinar qu tratamiento es mejor para usted. Algunas personas obtienen mejora con una terapia. Sin embargo, otras personas requieren una combinacin de terapias.  Document Released: 06/07/2012 Document Revised: 06/27/2013 ExitCare Patient Information 2015 [employer]. This information is not intended to replace advice given to you by your health care provider. Make sure you discuss any questions you have with your health care provider.  

## 2014-09-14 NOTE — Progress Notes (Signed)
Patient here for follow up for anxiety. Patient denies any pain today. Patient has not taken her anxiety medication for today. Patient reports that klonopin has been helping her with her anxiety, but if she stops taking the medication it comes back. Patient reports she has a few left, but does not have refills on it.

## 2014-09-14 NOTE — Progress Notes (Signed)
Patient ID: Julia Roy, female   DOB: 1980/03/02, 34 y.o.   MRN: [id number]  CC: anxiety   HPI:           Julia Roy is a 34 y.o. female here today for a follow up visit.  Patient has past medical history of anxiety. Patient was seen here last month for symptoms of anxiety. She states that when using the Klonopin she feels better and does not have the generalized tingling. When she stops the medication she notices the tingling comes back especially after getting stressed. She would like to start on SSRI today.   No Known Allergies History reviewed. No pertinent past medical history. Current Outpatient Prescriptions on File Prior to Visit  Medication Sig Dispense Refill  . clonazePAM (KLONOPIN) 0.5 MG tablet Take 1 tablet (0.5 mg total) by mouth 2 (two) times daily. For 2 weeks. After that use only as needed 45 tablet 0  . diclofenac (CATAFLAM) 50 MG tablet Take 1 tablet (50 mg total) by mouth 3 (three) times daily. as needed for pain. Take with food (Patient not taking: Reported on 08/16/2014) 21 tablet 0  . HYDROcodone-acetaminophen (NORCO/VICODIN) 5-325 MG per tablet Take 1 tablet by mouth every 4 (four) hours as needed. (Patient not taking: Reported on 08/16/2014) 15 tablet 0  . meloxicam (MOBIC) 15 MG tablet Take 1 tablet (15 mg total) by mouth daily. Take 1 daily with food. (Patient not taking: Reported on 08/16/2014) 10 tablet 0  . metroNIDAZOLE (FLAGYL) 500 MG tablet Take 1 tablet (500 mg total) by mouth 2 (two) times daily. (Patient not taking: Reported on 08/16/2014) 14 tablet 0   No current facility-administered medications on file prior to visit.   Family History  Problem Relation Age of Onset  . Stroke Mother   . Vision loss Mother    History   Social History  . Marital Status: Married    Spouse Name: N/A  . Number of Children: N/A  . Years of Education: N/A   Occupational History  . Not on file.   Social History Main Topics  . Smoking status: Never Smoker   .  Smokeless tobacco: Not on file  . Alcohol Use: No  . Drug Use: No  . Sexual Activity: Yes    Birth Control/ Protection: Implant   Other Topics Concern  . Not on file   Social History Narrative    Review of Systems  Neurological: Positive for tingling and headaches.  Psychiatric/Behavioral: Negative for depression and suicidal ideas. The patient is nervous/anxious.   All other systems reviewed and are negative.   Objective:   Filed Vitals:   09/14/14 0912  BP: 110/73  Pulse: 65  Temp: 98 F (36.7 C)  Resp: 16    Physical Exam  Constitutional: She is oriented to person, place, and time.  Cardiovascular: Normal rate, regular rhythm and normal heart sounds.   Pulmonary/Chest: Effort normal and breath sounds normal.  Neurological: She is alert and oriented to person, place, and time.  Psychiatric: She has a normal mood and affect.   .  Lab Results  Component Value Date   WBC 6.6 08/16/2014   HGB 12.2 08/16/2014   HCT 38.2 08/16/2014   MCV 84.0 08/16/2014   PLT 229 08/16/2014   Lab Results  Component Value Date   CREATININE 0.61 08/15/2013   BUN 10 08/15/2013   NA 138 08/15/2013   K 3.9 08/15/2013   CL 107 08/15/2013   CO2 28 08/15/2013  No results found for: HGBA1C Lipid Panel     Component Value Date/Time   CHOL 130 08/15/2013 1745   TRIG 108 08/15/2013 1745   HDL 53 08/15/2013 1745   CHOLHDL 2.5 08/15/2013 1745   VLDL 22 08/15/2013 1745   LDLCALC 55 08/15/2013 1745       Assessment and plan:   Julia Roy was seen today for follow-up.  Diagnoses and all orders for this visit:  Anxiety Orders: -     Begin escitalopram (LEXAPRO) 10 MG tablet; Take 1 tablet (10 mg total) by mouth daily. GAD-7 score has improved to 4.   Due to language barrier, an interpreter was present during the history-taking and subsequent discussion (and for part of the physical exam) with this patient.  Return in about 3 months (around 12/15/2014) for  anxiety.     Ambrose Finland, NP-C [facility] [phone] 09/14/2014, 9:26 AM

## 2014-12-12 ENCOUNTER — Encounter: Payer: Self-pay | Admitting: Internal Medicine

## 2014-12-12 ENCOUNTER — Ambulatory Visit: Payer: Self-pay | Attending: Internal Medicine | Admitting: Internal Medicine

## 2014-12-12 VITALS — BP 119/81 | HR 72 | Temp 98.0°F | Resp 16 | Wt 154.0 lb

## 2014-12-12 DIAGNOSIS — M26609 Unspecified temporomandibular joint disorder, unspecified side: Secondary | ICD-10-CM | POA: Insufficient documentation

## 2014-12-12 DIAGNOSIS — F419 Anxiety disorder, unspecified: Secondary | ICD-10-CM | POA: Insufficient documentation

## 2014-12-12 DIAGNOSIS — Z23 Encounter for immunization: Secondary | ICD-10-CM | POA: Insufficient documentation

## 2014-12-12 DIAGNOSIS — Z79899 Other long term (current) drug therapy: Secondary | ICD-10-CM | POA: Insufficient documentation

## 2014-12-12 MED ORDER — CYCLOBENZAPRINE HCL 5 MG PO TABS: 5.0000 mg | ORAL_TABLET | Freq: Three times a day (TID) | ORAL | Status: DC | PRN

## 2014-12-12 MED ORDER — NAPROXEN 500 MG PO TABS: 500.0000 mg | ORAL_TABLET | Freq: Two times a day (BID) | ORAL | Status: DC

## 2014-12-12 NOTE — Progress Notes (Signed)
Interpreter used [place] ID# [id number] Patient complains of headache neck pain and jaw pain that  Started a little over a week ago

## 2014-12-12 NOTE — Patient Instructions (Signed)
Sndrome de la articulacin temporomandibular (Temporomandibular Joint Syndrome) El sndrome de Sports coach (ATM) afecta las articulaciones que se encuentran entre la [place] y el crneo. Las articulaciones temporomandibulares estn ubicadas cerca de las orejas y permiten que la [place] se abra y se cierre. Estas articulaciones y los msculos adyacentes intervienen en todos los movimientos de la [place]. Las Engineer, manufacturing con sndrome de la articulacin temporomandibular tienen dolor en la zona de estas articulaciones y [employer]. Masticar, morder u Field seismologist otros movimientos con la mandbula puede ser difcil o doloroso. Las causas de este sndrome pueden ser diversas. En muchos casos, la afeccin es leve y desaparece en el trmino de unas pocas semanas. En algunas personas, la afeccin puede convertirse en un problema prolongado. CAUSAS Las causas posibles del sndrome de la articulacin temporomandibular incluyen lo siguiente:  Licensed conveyancer (bruxismo) o Engineer, maintenance (IT). Algunas personas lo hacen cuando estn bajo estrs.  Artritis.  Lesin mandibular.  Lesin en la cabeza o el cuello.  Piezas dentales o dentaduras postizas que no estn bien alineadas. En algunos casos, es posible que la causa de este sndrome no se conozca. Emerado sntoma ms comn es un dolor continuo al costado de la cabeza en la zona de la articulacin temporomandibular. Otros sntomas pueden ser los siguientes:  Dolor al mover la [place], por ejemplo, al [employer] o morder.  Imposibilidad de abrir [employer].  Producir un chasquido al abrir la boca.  Dolor de [place].  Dolor de odos.  Dolor en el cuello o el hombro. DIAGNSTICO Generalmente, se puede hacer el diagnstico en funcin de los sntomas, la historia clnica y un examen fsico. El mdico puede revisar el rango de movimiento de la [place]. A veces se realizan estudios de diagnstico  por imgenes, como radiografas o resonancias magnticas (RM). Tal vez deba consultar al dentista para que determine si las piezas dentales y la mandbula estn alineadas correctamente. TRATAMIENTO El sndrome de la articulacin temporomandibular suele desaparecer solo. Si es Arts development officer, las opciones pueden incluir lo siguiente:  Consumir alimentos blandos y Midwife hielo o Freight forwarder.  Medicamentos para Best boy o la inflamacin.  Medicamentos para Scientist, research (life sciences).  Una placa oclusal, una placa de mordida o una boquilla para evitar que se rechinen los dientes o se aprieten las [place].  Tcnicas de relajacin o psicoterapia para ayudar a Software engineer.  Neuroestimulacin elctrica transcutnea (NET). Esto ayuda a Best boy al aplicar una corriente elctrica a travs de la piel.  Acupuntura. A veces, esta opcin ayuda a Best boy.  Ciruga de mandbula que debe realizarse en contadas ocasiones. [place] los medicamentos solamente como se lo haya indicado el mdico.  Consuma una dieta blanda si tiene dificultades para Engineer, manufacturing systems.  Aplique hielo sobre la zona dolorida.  Ponga el hielo en una bolsa plstica.  Coloque una toalla entre la piel y la bolsa de hielo.  Coloque el hielo durante 20 minutos, 2 a 3 veces por da.  Aplique una compresa tibia sobre la zona dolorida como se lo hayan indicado.  Hgase masajes en la zona de la mandbula y haga ejercicios de estiramiento de la mandbula como se lo haya recomendado el mdico.  Si le indicaron que use una boquilla o una placa de mordida, hgalo como se lo indicaron.  No consuma los alimentos que Energy manager. No mastique goma de Higher education careers adviser.  Concurra a [place]  lo haya recomendado el médico.  · Si le indicaron que use una boquilla o una placa de mordida, hágalo como se lo indicaron.  · No consuma los alimentos que exijan mucha masticación. No mastique goma de mascar.  · Concurra a todas las visitas de control como se lo haya indicado el médico. Esto es importante.  SOLICITE ATENCIÓN MÉDICA SI:  · Tiene problemas para comer.  · Tiene síntomas nuevos o estos  empeoran.  SOLICITE ATENCIÓN MÉDICA DE INMEDIATO SI:  · Se le queda trabada la mandíbula abierta o cerrada.     Esta información no tiene como fin reemplazar el consejo del médico. Asegúrese de hacerle al médico cualquier pregunta que tenga.     Document Released: 11/20/2004 Document Revised: 03/03/2014  Elsevier Interactive Patient Education ©2016 Elsevier Inc.

## 2014-12-12 NOTE — Progress Notes (Signed)
Patient ID: Julia Roy, female   DOB: 11-Apr-1980, 34 y.o.   MRN: [id number]  CC: headache, neck pain  HPI: Julia Roy is a 34 y.o. female here today for a follow up visit.  Patient has past medical history of anxiety. Patient reports that she has been having symptoms of headache, neck pain, and jaw pain for 4 days. She states that the pain began in her right side of jaw. Whenever she opens her mouth to eat she has severe pain. She states that the pain radiates to her face, head, and back of neck. She denies grinding teeth, chewing gum, or crunching ice.   No Known Allergies History reviewed. No pertinent past medical history. Current Outpatient Prescriptions on File Prior to Visit  Medication Sig Dispense Refill  . clonazePAM (KLONOPIN) 0.5 MG tablet Take 1 tablet (0.5 mg total) by mouth 2 (two) times daily. For 2 weeks. After that use only as needed 45 tablet 0  . escitalopram (LEXAPRO) 10 MG tablet Take 1 tablet (10 mg total) by mouth daily. 30 tablet 3  . diclofenac (CATAFLAM) 50 MG tablet Take 1 tablet (50 mg total) by mouth 3 (three) times daily. as needed for pain. Take with food (Patient not taking: Reported on 08/16/2014) 21 tablet 0  . HYDROcodone-acetaminophen (NORCO/VICODIN) 5-325 MG per tablet Take 1 tablet by mouth every 4 (four) hours as needed. (Patient not taking: Reported on 08/16/2014) 15 tablet 0  . meloxicam (MOBIC) 15 MG tablet Take 1 tablet (15 mg total) by mouth daily. Take 1 daily with food. (Patient not taking: Reported on 08/16/2014) 10 tablet 0  . metroNIDAZOLE (FLAGYL) 500 MG tablet Take 1 tablet (500 mg total) by mouth 2 (two) times daily. (Patient not taking: Reported on 08/16/2014) 14 tablet 0   No current facility-administered medications on file prior to visit.   Family History  Problem Relation Age of Onset  . Stroke Mother   . Vision loss Mother    Social History   Social History  . Marital Status: Married    Spouse Name: N/A  . Number of  Children: N/A  . Years of Education: N/A   Occupational History  . Not on file.   Social History Main Topics  . Smoking status: Never Smoker   . Smokeless tobacco: Not on file  . Alcohol Use: No  . Drug Use: No  . Sexual Activity: Yes    Birth Control/ Protection: Implant   Other Topics Concern  . Not on file   Social History Narrative    Review of Systems: Other than what is stated in HPI, all other systems are negative.   Objective:   Filed Vitals:   12/12/14 1451  BP: 119/81  Pulse: 72  Temp: 98 F (36.7 C)  Resp: 16    Physical Exam  HENT:  Right Ear: External ear normal.  Left Ear: External ear normal.  Clicking heard/felt when opening jaw at TMJ  Eyes: Pupils are equal, round, and reactive to light.  Neck: Normal range of motion. Neck supple.  Lymphadenopathy:    She has no cervical adenopathy.  Skin: Skin is warm and dry. No erythema.  Psychiatric: She has a normal mood and affect.     Lab Results  Component Value Date   WBC 6.6 08/16/2014   HGB 12.2 08/16/2014   HCT 38.2 08/16/2014   MCV 84.0 08/16/2014   PLT 229 08/16/2014   Lab Results  Component Value Date   CREATININE 0.61 08/15/2013  BUN 10 08/15/2013   NA 138 08/15/2013   K 3.9 08/15/2013   CL 107 08/15/2013   CO2 28 08/15/2013    No results found for: HGBA1C Lipid Panel     Component Value Date/Time   CHOL 130 08/15/2013 1745   TRIG 108 08/15/2013 1745   HDL 53 08/15/2013 1745   CHOLHDL 2.5 08/15/2013 1745   VLDL 22 08/15/2013 1745   LDLCALC 55 08/15/2013 1745       Assessment and plan:   Julia Roy was seen today for neck pain.  Diagnoses and all orders for this visit:  TMJ (temporomandibular joint syndrome) -    Begin naproxen (NAPROSYN) 500 MG tablet; Take 1 tablet (500 mg total) by mouth 2 (two) times daily with a meal. For pain and inflammation -     Begin cyclobenzaprine (FLEXERIL) 5 MG tablet; Take 1 tablet (5 mg total) by mouth 3 (three) times daily as  needed for muscle spasms. No chewing gum, hard/sticky foods until inflammation of joint resolves  Need for influenza vaccination -     Flu Vaccine QUAD 36+ mos PF IM (Fluarix & Fluzone Quad PF)  Due to language barrier, an interpreter was present during the history-taking and subsequent discussion (and for part of the physical exam) with this patient.  Return if symptoms worsen or fail to improve.        Ambrose FinlandValerie A Baleigh Rennaker, NP-C [facility] [phone] 12/12/2014, 2:53 PM

## 2016-08-11 ENCOUNTER — Ambulatory Visit: Payer: Self-pay | Attending: Internal Medicine

## 2016-10-17 ENCOUNTER — Ambulatory Visit: Payer: Self-pay | Attending: Family Medicine | Admitting: Family Medicine

## 2016-10-17 ENCOUNTER — Telehealth: Payer: Self-pay | Admitting: Family Medicine

## 2016-10-17 ENCOUNTER — Encounter: Payer: Self-pay | Admitting: Family Medicine

## 2016-10-17 ENCOUNTER — Ambulatory Visit (HOSPITAL_COMMUNITY)
Admission: RE | Admit: 2016-10-17 | Discharge: 2016-10-17 | Disposition: A | Discharge status: Home / Self Care | Payer: Self-pay | Source: Ambulatory Visit | Attending: Family Medicine | Admitting: Family Medicine

## 2016-10-17 VITALS — BP 114/71 | HR 62 | Temp 97.7°F | Resp 18 | Ht 62.0 in | Wt 158.2 lb

## 2016-10-17 DIAGNOSIS — Z9181 History of falling: Secondary | ICD-10-CM | POA: Insufficient documentation

## 2016-10-17 DIAGNOSIS — R51 Headache: Secondary | ICD-10-CM | POA: Insufficient documentation

## 2016-10-17 DIAGNOSIS — J3489 Other specified disorders of nose and nasal sinuses: Secondary | ICD-10-CM

## 2016-10-17 DIAGNOSIS — W010XXA Fall on same level from slipping, tripping and stumbling without subsequent striking against object, initial encounter: Secondary | ICD-10-CM | POA: Insufficient documentation

## 2016-10-17 DIAGNOSIS — Z23 Encounter for immunization: Secondary | ICD-10-CM

## 2016-10-17 DIAGNOSIS — Z0000 Encounter for general adult medical examination without abnormal findings: Secondary | ICD-10-CM

## 2016-10-17 DIAGNOSIS — Z975 Presence of (intrauterine) contraceptive device: Secondary | ICD-10-CM

## 2016-10-17 MED ORDER — IBUPROFEN 600 MG PO TABS: 600.0000 mg | ORAL_TABLET | Freq: Three times a day (TID) | ORAL | 0 refills | Status: DC | PRN

## 2016-10-17 NOTE — Progress Notes (Signed)
Patient is here for lower back pain all the way to legs both knee swollen legs numbness   Right hand numbness

## 2016-10-17 NOTE — Progress Notes (Signed)
Subjective:  Patient ID: Julia Roy, female    DOB: Oct 02, 1980  Age: 36 y.o. MRN: [id number]  CC: Establish Care   HPI Joni Hovell presents for complaints of headache. Symptoms began about 3 weeks ago. She reports history of fall 3 weeks ago when slipped on water on the floor,  fell forward,  and landed on her face. The headaches are usually mild to moderate in severity and are frontal in location.  The headaches are usually not preceded by an aura.The patient denies loss of balance and vision problems. She does reports epistasis after fall and episode of dizziness first week after fall. She denies any episodes in the last 2 weeks. Since the fall she reports nasal tenderness to touch.  Home treatment has included acetaminophen and aspirin with fair pain relief. Elevated PHQ 9/GAD score. Patient denies SI/HI. She declines speaking with LCSW or medication at this time. She reports having nexplanon in place and would like removal. She denies any symptoms.      Outpatient Medications Prior to Visit  Medication Sig Dispense Refill  . clonazePAM (KLONOPIN) 0.5 MG tablet Take 1 tablet (0.5 mg total) by mouth 2 (two) times daily. For 2 weeks. After that use only as needed (Patient not taking: Reported on 10/17/2016) 45 tablet 0  . cyclobenzaprine (FLEXERIL) 5 MG tablet Take 1 tablet (5 mg total) by mouth 3 (three) times daily as needed for muscle spasms. (Patient not taking: Reported on 10/17/2016) 30 tablet 1  . escitalopram (LEXAPRO) 10 MG tablet Take 1 tablet (10 mg total) by mouth daily. (Patient not taking: Reported on 10/17/2016) 30 tablet 3  . naproxen (NAPROSYN) 500 MG tablet Take 1 tablet (500 mg total) by mouth 2 (two) times daily with a meal. For pain and inflammation (Patient not taking: Reported on 10/17/2016) 60 tablet 0   No facility-administered medications prior to visit.     ROS Review of Systems  Constitutional: Negative.   HENT:       Nasal pain   Eyes: Negative.     Respiratory: Negative.   Cardiovascular: Negative.   Skin: Negative.   Neurological: Positive for headaches.  Psychiatric/Behavioral: Negative for suicidal ideas.   Objective:  BP 114/71 (BP Location: Left Arm, Patient Position: Sitting, Cuff Size: Normal)   Pulse 62   Temp 97.7 F (36.5 C) (Oral)   Resp 18   Ht 5\' 2"  (1.575 m)   Wt 158 lb 3.2 oz (71.8 kg)   LMP 10/03/2016   SpO2 100%   BMI 28.94 kg/m   BP/Weight 10/17/2016 12/12/2014 09/14/2014  Systolic BP 114 119 110  Diastolic BP 71 81 73  Wt. (Lbs) 158.2 154 155.4  BMI 28.94 28.16 28.42    Physical Exam  Constitutional: She is oriented to person, place, and time. She appears well-developed and well-nourished.  HENT:  Head: Normocephalic and atraumatic.  Right Ear: External ear normal.  Left Ear: External ear normal.  Nose: Sinus tenderness (nasal bridge) present. No septal deviation.  Mouth/Throat: Oropharynx is clear and moist.  Eyes: Pupils are equal, round, and reactive to light. Conjunctivae are normal.  Cardiovascular: Normal rate, regular rhythm, normal heart sounds and intact distal pulses.   Pulmonary/Chest: Effort normal and breath sounds normal.  Musculoskeletal: Normal range of motion.  Neurological: She is alert and oriented to person, place, and time. She has normal reflexes. No cranial nerve deficit.  Skin: Skin is warm and dry.  Psychiatric: She has a normal mood and affect. She expresses  no homicidal and no suicidal ideation. She expresses no suicidal plans and no homicidal plans.  Nursing note and vitals reviewed.   Assessment & Plan:   Problem List Items Addressed This Visit    None    Visit Diagnoses    History of recent fall    -  Primary   Relevant Orders   DG Nasal Bones (Completed)   Nasal tenderness       Relevant Medications   ibuprofen (ADVIL,MOTRIN) 600 MG tablet   Other Relevant Orders   DG Nasal Bones (Completed)   Nexplanon in place       Relevant Orders   Ambulatory  referral to [facility] maintenance       Relevant Orders   Tdap vaccine greater than or equal to 7yo IM (Completed)      Meds ordered this encounter  Medications  . ibuprofen (ADVIL,MOTRIN) 600 MG tablet    Sig: Take 1 tablet (600 mg total) by mouth every 8 (eight) hours as needed for moderate pain (Take with food).    Dispense:  40 tablet    Refill:  0    Order Specific Question:   Supervising Provider    Answer:   Quentin Angst [id number]    Follow-up: Return in about 1 month (around 11/17/2016), or if symptoms worsen or fail to improve, for Physical .   Lizbeth Bark FNP

## 2016-10-17 NOTE — Telephone Encounter (Signed)
Interpreter services used [place] [phone]. Called patient and informed of imaging results. Recommend NSAID and cold therapy as need. If she develops symptoms of anosmia, epistaxis, or difficulty breathing through nasal passages, encouraged to follow up for referral.

## 2016-10-17 NOTE — Patient Instructions (Signed)
Dolor musculoesquelético  (Musculoskeletal Pain)  El dolor musculoesquelético comprende dolores y molestias en los músculos y en los huesos. Este dolor puede ocurrir en cualquier parte del cuerpo.  INSTRUCCIONES PARA EL CUIDADO EN EL HOGAR  · Solo tome medicamentos para calmar el dolor, el malestar o bajar la fiebre, según las indicaciones del médico.  · Podrá seguir con todas las actividades a menos que éstas le ocasionen más dolor. Cuando el dolor disminuya, retome las actividades habituales lentamente. Aumente gradualmente la intensidad y la duración de sus actividades o del ejercicio.  · Durante los períodos de dolor intenso, el reposo en cama puede ser beneficioso. Acuéstese o siéntese en cualquier posición que sea cómoda, pero salga de la cama y camine al menos cada varias horas.  · Si se lo indican, aplique hielo sobre la zona de la lesión.  ? Ponga el hielo en una bolsa plástica.  ? Coloque una toalla entre la piel y la bolsa de hielo.  ? Coloque el hielo durante 20 minutos, 2 a 3 veces por día.    SOLICITE ATENCIÓN MÉDICA SI:  · El dolor empeora.  · El dolor no se alivia con los medicamentos.  · Pierde la funcionalidad en la zona del dolor si este se manifiesta en los brazos, las piernas o el cuello.    Esta información no tiene como fin reemplazar el consejo del médico. Asegúrese de hacerle al médico cualquier pregunta que tenga.  Document Released: 11/20/2004 Document Revised: 05/05/2011 Document Reviewed: 10/15/2012  Elsevier Interactive Patient Education © 2017 Elsevier Inc.

## 2016-11-17 ENCOUNTER — Encounter: Payer: Self-pay | Admitting: Family Medicine

## 2016-11-17 ENCOUNTER — Other Ambulatory Visit: Payer: Self-pay

## 2016-11-17 ENCOUNTER — Ambulatory Visit: Payer: Self-pay | Attending: Family Medicine | Admitting: Licensed Clinical Social Worker

## 2016-11-17 ENCOUNTER — Ambulatory Visit: Payer: Self-pay | Attending: Family Medicine | Admitting: Family Medicine

## 2016-11-17 ENCOUNTER — Ambulatory Visit (HOSPITAL_COMMUNITY)
Admission: RE | Admit: 2016-11-17 | Discharge: 2016-11-17 | Disposition: A | Discharge status: Home / Self Care | Payer: Self-pay | Source: Ambulatory Visit | Attending: Family Medicine | Admitting: Family Medicine

## 2016-11-17 VITALS — BP 107/71 | HR 53 | Temp 98.1°F | Resp 18 | Ht 62.0 in | Wt 158.8 lb

## 2016-11-17 DIAGNOSIS — Z823 Family history of stroke: Secondary | ICD-10-CM | POA: Insufficient documentation

## 2016-11-17 DIAGNOSIS — G8929 Other chronic pain: Secondary | ICD-10-CM | POA: Insufficient documentation

## 2016-11-17 DIAGNOSIS — M5441 Lumbago with sciatica, right side: Secondary | ICD-10-CM | POA: Insufficient documentation

## 2016-11-17 DIAGNOSIS — Z8249 Family history of ischemic heart disease and other diseases of the circulatory system: Secondary | ICD-10-CM | POA: Insufficient documentation

## 2016-11-17 DIAGNOSIS — F419 Anxiety disorder, unspecified: Secondary | ICD-10-CM | POA: Insufficient documentation

## 2016-11-17 DIAGNOSIS — M545 Low back pain: Secondary | ICD-10-CM | POA: Insufficient documentation

## 2016-11-17 DIAGNOSIS — Z0001 Encounter for general adult medical examination with abnormal findings: Secondary | ICD-10-CM | POA: Insufficient documentation

## 2016-11-17 DIAGNOSIS — Z0000 Encounter for general adult medical examination without abnormal findings: Secondary | ICD-10-CM | POA: Insufficient documentation

## 2016-11-17 DIAGNOSIS — R9431 Abnormal electrocardiogram [ECG] [EKG]: Secondary | ICD-10-CM | POA: Insufficient documentation

## 2016-11-17 DIAGNOSIS — R0789 Other chest pain: Secondary | ICD-10-CM | POA: Insufficient documentation

## 2016-11-17 LAB — POCT GLYCOSYLATED HEMOGLOBIN (HGB A1C): HEMOGLOBIN A1C: 5.5

## 2016-11-17 MED ORDER — IBUPROFEN 600 MG PO TABS: 600.0000 mg | ORAL_TABLET | Freq: Three times a day (TID) | ORAL | 1 refills | Status: DC | PRN

## 2016-11-17 MED ORDER — CYCLOBENZAPRINE HCL 10 MG PO TABS: 10.0000 mg | ORAL_TABLET | Freq: Three times a day (TID) | ORAL | 0 refills | Status: DC | PRN

## 2016-11-17 NOTE — BH Specialist Note (Signed)
Integrated Behavioral Health Initial Visit  MRN: [id number] Name: Julia Roy  Number of Integrated Behavioral Health Clinician visits:: 1/6 Session Start time: 3:00 PM  Session End time: 3:30 PM Total time: 30 minutes  Type of Service: Integrated Behavioral Health- Individual/Family Interpretor:Yes.   Interpretor Name and Language: Clifton Custard Josue ZH#[id number] Spanish   Warm Hand Off Completed.       SUBJECTIVE: Julia Roy is a 36 y.o. female accompanied by self Patient was referred by FNP Hairston for anxiety. Patient reports the following symptoms/concerns: low energy, difficulty concentrating, feeling anxious, and panic attacks Duration of problem: Ongoing; Severity of problem: mild  OBJECTIVE: Mood: Anxious and Affect: Appropriate Risk of harm to self or others: No plan to harm self or others  LIFE CONTEXT: Family and Social: Pt receives support from family. She has four minor children School/Work: Pt is employed Self-Care: Pt listens to music and uses phone before bed Life Changes: Pt shared that she has difficulty with change. Pt's children are getting older and becoming less dependent, which saddens her.  GOALS ADDRESSED: Patient will: 1. Reduce symptoms of: anxiety 2. Increase knowledge and/or ability of: coping skills  3. Demonstrate ability to: Increase healthy adjustment to current life circumstances  INTERVENTIONS: Interventions utilized: Mindfulness or Management consultant, Supportive Counseling and Psychoeducation and/or Health Education  Standardized Assessments completed: GAD-7 and PHQ 2&9  ASSESSMENT: Patient currently experiencing anxiety.  She reports low energy, difficulty concentrating, feeling anxious, and panic attacks.    Patient may benefit from psychoeducation. LCSWA educated pt on how change and/or stress can negatively impact one's mental and physical health. Pt was open to learning about therapeutic interventions to decrease symptoms.  LCSWA provided pt with resources that promote emotional well-being and relaxation, in addition, to dental and food clinics in the community.   PLAN: 1. Follow up with behavioral health clinician on : Pt was encouraged to contact LCSWA if symptoms worsen or fail to improve to schedule behavioral appointments at [facility]. 2. Behavioral recommendations: LCSWA recommends that pt apply healthy coping skills discussed and utilize provided resources. Pt is encouraged to schedule follow up appointment with LCSWA 3. Referral(s): Community Resources:  Producer, television/film/video 4. "From scale of 1-10, how likely are you to follow plan?": 8/10  Bridgett Larsson, LCSW 11/18/16 10:00 AM

## 2016-11-17 NOTE — Progress Notes (Signed)
Patient is here for physical  Patient complains lower back pain

## 2016-11-17 NOTE — Progress Notes (Signed)
Subjective:   Patient ID: Julia Roy, female    DOB: [birth year], 36 y.o.   MRN: [id number]  Chief Complaint  Patient presents with  . Annual Exam   HPI Julia Roy 36 y.o. female presents comprehensive annual physical examination. She reports family history of HTN- mother, cancer- mother. Denies FH of DM or heart disease. She reports episode of intermittent chest pain with running. Onset was 1 month ago, with unchanged course since that time. The patient describes the pain as , dull in nature, left sided, and  does not radiate. Patient rates pain as a 4/10 in intensity.  Associated symptoms are none. Aggravating factors are prolonged physical activity.  Alleviating factors are: none. Patient's cardiac risk factors are none.  Patient's risk factors for DVT/PE: none. Previous cardiac testing: none. She  complains of chronic low back pain. The patient first noted symptoms 8 to 9 years ago. It was related to lifting a heavy object.  She reports treatment in past has included chiropractor and exercises. She reports back pain began several months ago intermittently, but has been constant for the last 3 weeks. The pain is rated 6 /10, and is located at the bilateral lumbar. The pain is described as burning, deep aching . Symptoms are exacerbated by movement. She reports resting provides some relief. Other associated symptoms include tingling in the right leg and burning pain in the right legShe complains of anxious mood.  She has the following symptoms: psychomotor agitation, apetite changes. Onset of symptoms was approximately 2 years ago, gradually worsening since that time. She denies current suicidal and homicidal ideation.  Risk factors: previous episode of anxiety Previous treatment includes klonopin a few years ago.    No past medical history on file.  No past surgical history on file.  Family History  Problem Relation Age of Onset  . Stroke Mother   . Vision loss Mother   .  Hypertension Mother   . Cancer Mother     Social History   Social History  . Marital status: Married    Spouse name: N/A  . Number of children: N/A  . Years of education: N/A   Occupational History  . Not on file.   Social History Main Topics  . Smoking status: Never Smoker  . Smokeless tobacco: Never Used  . Alcohol use No  . Drug use: No  . Sexual activity: Yes    Birth control/ protection: Implant   Other Topics Concern  . Not on file   Social History Narrative  . No narrative on file    Outpatient Medications Prior to Visit  Medication Sig Dispense Refill  . ibuprofen (ADVIL,MOTRIN) 600 MG tablet Take 1 tablet (600 mg total) by mouth every 8 (eight) hours as needed for moderate pain (Take with food). 40 tablet 0   No facility-administered medications prior to visit.     No Known Allergies  Review of Systems  Constitutional: Negative.   HENT: Negative.   Eyes: Negative.   Respiratory: Negative.   Cardiovascular: Positive for chest pain (history with activity).  Gastrointestinal: Negative.   Genitourinary: Negative.   Musculoskeletal: Positive for back pain.  Skin: Negative.   Neurological: Negative.   Endo/Heme/Allergies: Negative.   Psychiatric/Behavioral: Negative for suicidal ideas. The patient is nervous/anxious.       Objective:    Physical Exam  Constitutional: She is oriented to person, place, and time. She appears well-developed and well-nourished.  HENT:  Head: Normocephalic and atraumatic.  Right Ear: External  ear normal.  Left Ear: External ear normal.  Nose: Nose normal.  Mouth/Throat: Oropharynx is clear and moist.  Eyes: Pupils are equal, round, and reactive to light. Conjunctivae and EOM are normal.  Neck: Normal range of motion. Neck supple. No JVD present.  Cardiovascular: Normal rate, regular rhythm, normal heart sounds and intact distal pulses.   No CP reproducible with palpation.   Pulmonary/Chest: Effort normal and breath  sounds normal.  Abdominal: Soft. Bowel sounds are normal. There is no tenderness.  Musculoskeletal: Normal range of motion.       Lumbar back: She exhibits pain (positive straight leg test-RLE).  Neurological: She is alert and oriented to person, place, and time. She has normal reflexes. She displays a negative Romberg sign. Coordination and gait normal.  Skin: Skin is warm and dry.  Psychiatric: Her mood appears anxious. She expresses no homicidal and no suicidal ideation. She expresses no suicidal plans and no homicidal plans.  Nursing note and vitals reviewed.   BP 107/71 (BP Location: Left Arm, Patient Position: Sitting, Cuff Size: Normal)   Pulse (!) 53   Temp 98.1 F (36.7 C) (Oral)   Resp 18   Ht  (1.575 m)   Wt 158 lb 12.8 oz (72 kg)   SpO2 99%   BMI 29.04 kg/m  Wt Readings from Last 3 Encounters:  11/17/16 158 lb 12.8 oz (72 kg)  10/17/16 158 lb 3.2 oz (71.8 kg)  12/12/14 154 lb (69.9 kg)    Immunization History  Administered Date(s) Administered  . Influenza,inj,Quad PF,6+ Mos 12/12/2014  . Tdap 10/17/2016      Lab Results  Component Value Date   TSH 1.335 08/16/2014   Lab Results  Component Value Date   WBC 6.6 08/16/2014   HGB 12.2 08/16/2014   HCT 38.2 08/16/2014   MCV 84.0 08/16/2014   PLT 229 08/16/2014   Lab Results  Component Value Date   NA 138 08/15/2013   K 3.9 08/15/2013   CO2 28 08/15/2013   GLUCOSE 75 08/15/2013   BUN 10 08/15/2013   CREATININE 0.61 08/15/2013   BILITOT 0.2 08/15/2013   ALKPHOS 53 08/15/2013   AST 15 08/15/2013   ALT 10 08/15/2013   PROT 6.6 08/15/2013   ALBUMIN 4.1 08/15/2013   CALCIUM 9.0 08/15/2013   Lab Results  Component Value Date   CHOL 130 08/15/2013   Lab Results  Component Value Date   HDL 53 08/15/2013   Lab Results  Component Value Date   LDLCALC 55 08/15/2013   Lab Results  Component Value Date   TRIG 108 08/15/2013   Lab Results  Component Value Date   CHOLHDL 2.5 08/15/2013    Lab Results  Component Value Date   HGBA1C 5.5 11/17/2016       Assessment & Plan:   1. Annual physical exam  - CMP and Liver - Lipid Panel - CBC - POCT glycosylated hemoglobin (Hb A1C) - TSH - Vitamin D, 25-hydroxy  2. Chronic bilateral low back pain with right-sided sciatica  - DG Lumbar Spine Complete; Future - ibuprofen (ADVIL,MOTRIN) 600 MG tablet; Take 1 tablet (600 mg total) by mouth every 8 (eight) hours as needed for moderate pain (Take with food).  Dispense: 40 tablet; Refill: 1 - cyclobenzaprine (FLEXERIL) 10 MG tablet; Take 1 tablet (10 mg total) by mouth 3 (three) times daily as needed for muscle spasms.  Dispense: 40 tablet; Refill: 0  3. Anxiety LCSW spoke with patient and provided resources.  4. Chest wall pain  - ECHOCARDIOGRAM COMPLETE; Future  5. Abnormal ECG  - CMP and Liver - CBC - TSH - ECHOCARDIOGRAM COMPLETE; Future  Meds ordered this encounter  Medications  . ibuprofen (ADVIL,MOTRIN) 600 MG tablet    Sig: Take 1 tablet (600 mg total) by mouth every 8 (eight) hours as needed for moderate pain (Take with food).    Dispense:  40 tablet    Refill:  1    Order Specific Question:   Supervising Provider    Answer:   Quentin Angst [id number]  . cyclobenzaprine (FLEXERIL) 10 MG tablet    Sig: Take 1 tablet (10 mg total) by mouth 3 (three) times daily as needed for muscle spasms.    Dispense:  40 tablet    Refill:  0    Order Specific Question:   Supervising Provider    Answer:   Quentin Angst [[id number]]     Arrie Senate, FNP

## 2016-11-17 NOTE — Patient Instructions (Signed)
Dolor en la pared torácica  (Chest Wall Pain)  El dolor en la pared torácica se produce en los huesos y los músculos del pecho o alrededor de estos. A veces, una lesión causa este dolor. En ocasiones, la causa puede ser desconocida. Este dolor puede durar varias semanas.  INSTRUCCIONES PARA EL CUIDADO EN EL HOGAR  Esté atento a cualquier cambio en los síntomas. Tome estas medidas para aliviar el dolor:  · Haga reposo como se lo haya indicado el médico.  · Evite las actividades que causan dolor. Estas pueden ser aquellas que requieren el uso de los músculos del tórax, los abdominales o los laterales para levantar objetos pesados.  · Si se lo indican, aplique hielo sobre la zona dolorida:  ? Ponga el hielo en una bolsa plástica.  ? Coloque una toalla entre la piel y la bolsa de hielo.  ? Coloque el hielo durante 20 minutos, 2 a 3 veces por día.  · Tome los medicamentos de venta libre y los recetados solamente como se lo haya indicado el médico.  · No consuma productos que contengan tabaco, incluidos cigarrillos, tabaco de mascar y cigarrillos electrónicos. Si necesita ayuda para dejar de fumar, consulte al médico.  · Concurra a todas las visitas de control como se lo haya indicado el médico. Esto es importante.  SOLICITE ATENCIÓN MÉDICA SI:  · Tiene fiebre.  · El dolor de pecho empeora.  · Aparecen nuevos síntomas.    SOLICITE ATENCIÓN MÉDICA DE INMEDIATO SI:  · Tiene náuseas o vómitos.  · Transpira o tiene sensación de desvanecimiento.  · Tiene tos con flema (esputo) o expectora sangre al toser.  · Le falta el aire.    Esta información no tiene como fin reemplazar el consejo del médico. Asegúrese de hacerle al médico cualquier pregunta que tenga.  Document Released: 03/24/2006 Document Revised: 11/01/2014 Document Reviewed: 05/08/2014  Elsevier Interactive Patient Education © 2017 Elsevier Inc.

## 2016-11-18 LAB — CMP AND LIVER
ALK PHOS: 72 IU/L (ref 39–117)
ALT: 12 IU/L (ref 0–32)
AST: 20 IU/L (ref 0–40)
Albumin: 4.5 g/dL (ref 3.5–5.5)
BILIRUBIN, DIRECT: 0.06 mg/dL (ref 0.00–0.40)
BUN: 9 mg/dL (ref 6–20)
Bilirubin Total: <0.2 mg/dL (ref 0.0–1.2)
CALCIUM: 9.3 mg/dL (ref 8.7–10.2)
CO2: 21 mmol/L (ref 20–29)
Chloride: 107 mmol/L — ABNORMAL HIGH (ref 96–106)
Creatinine, Ser: 0.69 mg/dL (ref 0.57–1.00)
GFR calc Af Amer: 129 mL/min/{1.73_m2} (ref >59)
GFR calc non Af Amer: 112 mL/min/{1.73_m2} (ref >59)
Glucose: 82 mg/dL (ref 65–99)
Potassium: 4 mmol/L (ref 3.5–5.2)
Sodium: 142 mmol/L (ref 134–144)
Total Protein: 7.6 g/dL (ref 6.0–8.5)

## 2016-11-18 LAB — CBC
HEMATOCRIT: 40.3 % (ref 34.0–46.6)
HEMOGLOBIN: 13.1 g/dL (ref 11.1–15.9)
MCH: 27.2 pg (ref 26.6–33.0)
MCHC: 32.5 g/dL (ref 31.5–35.7)
MCV: 84 fL (ref 79–97)
Platelets: 123 10*3/uL — ABNORMAL LOW (ref 150–379)
RBC: 4.82 x10E6/uL (ref 3.77–5.28)
RDW: 13.2 % (ref 12.3–15.4)
WBC: 8.3 10*3/uL (ref 3.4–10.8)

## 2016-11-18 LAB — LIPID PANEL
CHOL/HDL RATIO: 3.8 ratio (ref 0.0–4.4)
CHOLESTEROL TOTAL: 184 mg/dL (ref 100–199)
HDL: 49 mg/dL (ref >39)
LDL CALC: 105 mg/dL — AB (ref 0–99)
TRIGLYCERIDES: 151 mg/dL — AB (ref 0–149)
VLDL Cholesterol Cal: 30 mg/dL (ref 5–40)

## 2016-11-18 LAB — VITAMIN D 25 HYDROXY (VIT D DEFICIENCY, FRACTURES): VIT D 25 HYDROXY: 18.2 ng/mL — AB (ref 30.0–100.0)

## 2016-11-18 LAB — TSH: TSH: 2.96 u[IU]/mL (ref 0.450–4.500)

## 2016-11-24 ENCOUNTER — Other Ambulatory Visit: Payer: Self-pay | Admitting: Family Medicine

## 2016-11-24 ENCOUNTER — Ambulatory Visit (HOSPITAL_COMMUNITY)
Admission: RE | Admit: 2016-11-24 | Discharge: 2016-11-24 | Disposition: A | Discharge status: Home / Self Care | Payer: Self-pay | Source: Ambulatory Visit | Attending: Family Medicine | Admitting: Family Medicine

## 2016-11-24 DIAGNOSIS — E559 Vitamin D deficiency, unspecified: Secondary | ICD-10-CM

## 2016-11-24 DIAGNOSIS — E782 Mixed hyperlipidemia: Secondary | ICD-10-CM

## 2016-11-24 DIAGNOSIS — R0789 Other chest pain: Secondary | ICD-10-CM

## 2016-11-24 DIAGNOSIS — I071 Rheumatic tricuspid insufficiency: Secondary | ICD-10-CM | POA: Insufficient documentation

## 2016-11-24 DIAGNOSIS — R9431 Abnormal electrocardiogram [ECG] [EKG]: Secondary | ICD-10-CM

## 2016-11-24 MED ORDER — VITAMIN D (ERGOCALCIFEROL) 1.25 MG (50000 UNIT) PO CAPS: 50000.0000 [IU] | ORAL_CAPSULE | ORAL | 0 refills | Status: DC

## 2016-11-24 MED ORDER — PRAVASTATIN SODIUM 20 MG PO TABS: 20.0000 mg | ORAL_TABLET | Freq: Every day | ORAL | 2 refills | Status: DC

## 2016-11-25 ENCOUNTER — Telehealth: Payer: Self-pay

## 2016-11-25 NOTE — Telephone Encounter (Signed)
CMA call regarding lab results   Patient did not answer   Patient left a detailed message in spanish per DPR & if have any questions just to call back

## 2016-11-25 NOTE — Telephone Encounter (Signed)
-----   Message from Lizbeth Bark, [place] sent at 11/24/2016  4:48 PM EDT ----- Labs that evaluated your blood cells, fluid and electrolyte balance are normal. No signs of anemia, infection, or inflammation present. Lipid levels were elevated. This can increase your risk of heart disease overtime. You will be prescribed pravastatin. Start eating a diet low in saturated fat. Limit your intake of fried foods, red meats, and whole milk.  Thyroid function normal Kidney function normal Liver function normal Vitamin D level was low. Vitamin D helps to keep bones strong. You were prescribed ergocalciferol (capsules) to increase your vitamin-d level.  Xray of lower back was normal. No fractures or misalignment of the spine or compression of the spinal discs. Recommend follow up in 3 months.

## 2016-12-04 ENCOUNTER — Telehealth: Payer: Self-pay

## 2016-12-04 ENCOUNTER — Telehealth: Payer: Self-pay | Admitting: Family Medicine

## 2016-12-04 DIAGNOSIS — E782 Mixed hyperlipidemia: Secondary | ICD-10-CM

## 2016-12-04 DIAGNOSIS — E559 Vitamin D deficiency, unspecified: Secondary | ICD-10-CM

## 2016-12-04 MED ORDER — VITAMIN D (ERGOCALCIFEROL) 1.25 MG (50000 UNIT) PO CAPS: 50000.0000 [IU] | ORAL_CAPSULE | ORAL | 0 refills | Status: AC

## 2016-12-04 MED ORDER — PRAVASTATIN SODIUM 20 MG PO TABS: 20.0000 mg | ORAL_TABLET | Freq: Every day | ORAL | 2 refills | Status: DC

## 2016-12-04 NOTE — Telephone Encounter (Signed)
-----   Message from Lizbeth Bark, [place] sent at 12/01/2016  5:25 PM EDT ----- -Echocardiogram which looks at your heart structure and function was normal. No enlargement or fluid around the heart. No significant leaking heart valves. Hearts pumping ability is normal.

## 2016-12-04 NOTE — Telephone Encounter (Signed)
CMA call regarding echo results   Patient did not answer but left a detailed message per DPR & if have any questions just to call back

## 2016-12-04 NOTE — Telephone Encounter (Signed)
Pt called to inform us that we sent her med to the wrong pharmacy, please sent it to [facility] pharmacy she will pick it up today  Vitamin D, Ergocalciferol, (DRISDOL) 50000 units CAPS capsule pravastatin (PRAVACHOL) 20 MG tablet

## 2016-12-04 NOTE — Telephone Encounter (Signed)
Resent to [facility] pharmacy. 

## 2016-12-18 ENCOUNTER — Encounter: Payer: Self-pay | Admitting: Family Medicine

## 2016-12-18 ENCOUNTER — Ambulatory Visit (INDEPENDENT_AMBULATORY_CARE_PROVIDER_SITE_OTHER): Payer: Self-pay | Admitting: Family Medicine

## 2016-12-18 VITALS — BP 110/80 | HR 67 | Temp 98.7°F | Wt 154.0 lb

## 2016-12-18 DIAGNOSIS — Z3009 Encounter for other general counseling and advice on contraception: Secondary | ICD-10-CM

## 2016-12-22 NOTE — Progress Notes (Signed)
Pt left due to no insurance

## 2017-04-03 ENCOUNTER — Ambulatory Visit (HOSPITAL_COMMUNITY)
Admission: RE | Admit: 2017-04-03 | Discharge: 2017-04-03 | Disposition: A | Discharge status: Home / Self Care | Payer: Self-pay | Source: Ambulatory Visit | Attending: Family Medicine | Admitting: Family Medicine

## 2017-04-03 ENCOUNTER — Ambulatory Visit: Payer: Self-pay | Attending: Family Medicine | Admitting: Family Medicine

## 2017-04-03 ENCOUNTER — Encounter: Payer: Self-pay | Admitting: Family Medicine

## 2017-04-03 ENCOUNTER — Ambulatory Visit: Payer: Self-pay

## 2017-04-03 VITALS — BP 107/71 | HR 54 | Temp 98.3°F | Ht 62.0 in | Wt 158.8 lb

## 2017-04-03 DIAGNOSIS — Z79899 Other long term (current) drug therapy: Secondary | ICD-10-CM | POA: Insufficient documentation

## 2017-04-03 DIAGNOSIS — G8929 Other chronic pain: Secondary | ICD-10-CM | POA: Insufficient documentation

## 2017-04-03 DIAGNOSIS — M791 Myalgia, unspecified site: Secondary | ICD-10-CM | POA: Insufficient documentation

## 2017-04-03 DIAGNOSIS — M5442 Lumbago with sciatica, left side: Secondary | ICD-10-CM | POA: Insufficient documentation

## 2017-04-03 DIAGNOSIS — R0789 Other chest pain: Secondary | ICD-10-CM | POA: Insufficient documentation

## 2017-04-03 DIAGNOSIS — Z791 Long term (current) use of non-steroidal anti-inflammatories (NSAID): Secondary | ICD-10-CM | POA: Insufficient documentation

## 2017-04-03 DIAGNOSIS — F419 Anxiety disorder, unspecified: Secondary | ICD-10-CM | POA: Insufficient documentation

## 2017-04-03 DIAGNOSIS — E782 Mixed hyperlipidemia: Secondary | ICD-10-CM | POA: Insufficient documentation

## 2017-04-03 DIAGNOSIS — Z8659 Personal history of other mental and behavioral disorders: Secondary | ICD-10-CM

## 2017-04-03 MED ORDER — HYDROXYZINE HCL 10 MG PO TABS: 10.0000 mg | ORAL_TABLET | Freq: Three times a day (TID) | ORAL | 0 refills | Status: DC | PRN

## 2017-04-03 MED ORDER — PRAVASTATIN SODIUM 20 MG PO TABS: 20.0000 mg | ORAL_TABLET | Freq: Every day | ORAL | 2 refills | Status: DC

## 2017-04-03 MED ORDER — NAPROXEN 500 MG PO TABS: ORAL_TABLET | ORAL | 1 refills | Status: DC

## 2017-04-03 NOTE — Progress Notes (Signed)
Subjective:  Patient ID: Julia Roy, female    DOB: 05-24-1980  Age: 37 y.o. MRN: [id number]  CC: Back Pain and Muscle Pain   HPI Julia Roy presents for c/o chronic chest wall pain. Onset a few months ago. She describes aching left sided, and  does not radiate. Associated symptoms  none. She denies any dyspnea, hemoptysis, fevers, or cough. Aggravating factors are prolonged physical activity.  Alleviating factors are: none. Patient's cardiac risk factors are none.  Patient's risk factors for DVT/PE: none. Previous cardiac testing: EKG and echocardiogram, which were unremarkable. CXR negative in the past.  She  complains of chronic low back pain. She reports treatment in past has included chiropractor and exercises. Pain is moderately severe and is located at the bilateral lumbar. The pain is described as  aching . Symptoms are exacerbated by movement. She reports resting provides some relief. Other associated symptoms include tingling in the right leg and burning pain in the right leg. Anxious mood : She has the following symptoms: psychomotor agitation, appetite changes. Onset of symptoms was over 2 years ago, gradually improving since that time. She denies current suicidal and homicidal ideation.  Risk factors: previous episode of anxiety. She report controlling her anxiety her herself through exercise and eating earlier foods.   Outpatient Medications Prior to Visit  Medication Sig Dispense Refill  . cyclobenzaprine (FLEXERIL) 10 MG tablet Take 1 tablet (10 mg total) by mouth 3 (three) times daily as needed for muscle spasms. 40 tablet 0  . pravastatin (PRAVACHOL) 20 MG tablet Take 1 tablet (20 mg total) by mouth daily. 30 tablet 2  . ibuprofen (ADVIL,MOTRIN) 600 MG tablet Take 1 tablet (600 mg total) by mouth every 8 (eight) hours as needed for moderate pain (Take with food). (Patient not taking: Reported on 04/03/2017) 40 tablet 1   No facility-administered medications prior to  visit.     ROS Review of Systems  Constitutional: Negative.   Respiratory: Negative.   Cardiovascular: Negative.   Musculoskeletal: Positive for back pain and myalgias.  Skin: Negative.   Psychiatric/Behavioral: Negative for suicidal ideas. The patient is nervous/anxious.         Objective:  BP 107/71 (BP Location: Right Arm, Patient Position: Sitting, Cuff Size: Small)   Pulse (!) 54   Temp 98.3 F (36.8 C) (Oral)   Ht 5\' 2"  (1.575 m)   Wt 158 lb 12.8 oz (72 kg)   SpO2 99%   BMI 29.04 kg/m   BP/Weight 04/03/2017 12/18/2016 11/17/2016  Systolic BP 107 110 107  Diastolic BP 71 80 71  Wt. (Lbs) 158.8 154 158.8  BMI 29.04 28.17 29.04     Physical Exam  Constitutional: She is oriented to person, place, and time. She appears well-developed and well-nourished.  Neck: No JVD present.  Cardiovascular: Normal rate, regular rhythm, normal heart sounds and intact distal pulses.  Pulmonary/Chest: Effort normal and breath sounds normal.  Chest wall pain reproducible with exam.  Abdominal: Soft. Bowel sounds are normal.  Musculoskeletal:       Lumbar back: She exhibits pain.  Neurological: She is alert and oriented to person, place, and time. She has normal strength and normal reflexes.  Skin: Skin is warm and dry.  Psychiatric: Her mood appears anxious. She expresses no homicidal and no suicidal ideation. She expresses no suicidal plans and no homicidal plans.  Nursing note and vitals reviewed.    Assessment & Plan:   1. Chronic left-sided low back pain with left-sided sciatica  -  naproxen (NAPROSYN) 500 MG tablet; TAKE ONE TABLET BY MOUTH TWICE A DAY WITH MEALS FOR 5 DAYS. THEN TAKE ONE TABLET BY MOUTH ONCE A DAY AS NEEDED FOR PAIN.  Dispense: 60 tablet; Refill: 1  2. Chest wall pain, chronic  - naproxen (NAPROSYN) 500 MG tablet; TAKE ONE TABLET BY MOUTH TWICE A DAY WITH MEALS FOR 5 DAYS. THEN TAKE ONE TABLET BY MOUTH ONCE A DAY AS NEEDED FOR PAIN.  Dispense: 60 tablet;  Refill: 1 - DG Chest 2 View; Future - CBC - Basic Metabolic Panel  3. History of anxiety  - hydrOXYzine (ATARAX/VISTARIL) 10 MG tablet; Take 1 tablet (10 mg total) by mouth 3 (three) times daily as needed for anxiety.  Dispense: 40 tablet; Refill: 0  4. Mixed hyperlipidemia  - pravastatin (PRAVACHOL) 20 MG tablet; Take 1 tablet (20 mg total) by mouth daily.  Dispense: 30 tablet; Refill: 2     Follow-up: Return in about 3 months (around 07/01/2017) for HLD/Anxiety .   Lizbeth BarkMandesia R Avari Nevares FNP

## 2017-04-03 NOTE — Patient Instructions (Signed)
Dolor en la pared torácica °Chest Wall Pain °El dolor en la pared torácica se produce en los huesos y los músculos del pecho o alrededor de estos. A veces, una lesión causa este dolor. En ocasiones, la causa puede ser desconocida. Este dolor puede durar varias semanas. °Siga estas instrucciones en su casa: °Esté atento a cualquier cambio en los síntomas. Tome estas medidas para aliviar el dolor: °· Haga reposo como se lo haya indicado el médico. °· Evite las actividades que causan dolor. Estas pueden ser aquellas que requieren el uso de los músculos del tórax, los abdominales o los laterales para levantar objetos pesados. °· Si se lo indican, aplique hielo sobre la zona dolorida: °? Ponga el hielo en una bolsa plástica. °? Coloque una toalla entre la piel y la bolsa de hielo. °? Coloque el hielo durante 20 minutos, 2 a 3 veces por día. °· Tome los medicamentos de venta libre y los recetados solamente como se lo haya indicado el médico. °· No consuma productos que contengan tabaco, incluidos cigarrillos, tabaco de mascar y cigarrillos electrónicos. Si necesita ayuda para dejar de fumar, consulte al médico. °· Concurra a todas las visitas de control como se lo haya indicado el médico. Esto es importante. ° °Comuníquese con un médico si: °· Tiene fiebre. °· El dolor de pecho empeora. °· Aparecen nuevos síntomas. °Solicite ayuda de inmediato si: °· Tiene náuseas o vómitos. °· Transpira o tiene sensación de desvanecimiento. °· Tiene tos con flema (esputo) o expectora sangre al toser. °· Le falta el aire. °Esta información no tiene como fin reemplazar el consejo del médico. Asegúrese de hacerle al médico cualquier pregunta que tenga. °Document Released: 03/24/2006 Document Revised: 05/12/2016 Document Reviewed: 05/08/2014 °Elsevier Interactive Patient Education © 2018 Elsevier Inc. ° °

## 2017-04-04 LAB — CBC
HEMATOCRIT: 39.2 % (ref 34.0–46.6)
Hemoglobin: 12.4 g/dL (ref 11.1–15.9)
MCH: 27.3 pg (ref 26.6–33.0)
MCHC: 31.6 g/dL (ref 31.5–35.7)
MCV: 86 fL (ref 79–97)
Platelets: 123 10*3/uL — ABNORMAL LOW (ref 150–379)
RBC: 4.55 x10E6/uL (ref 3.77–5.28)
RDW: 13.5 % (ref 12.3–15.4)
WBC: 6.4 10*3/uL (ref 3.4–10.8)

## 2017-04-04 LAB — BASIC METABOLIC PANEL
BUN / CREAT RATIO: 15 (ref 9–23)
BUN: 10 mg/dL (ref 6–20)
CALCIUM: 9.3 mg/dL (ref 8.7–10.2)
CO2: 21 mmol/L (ref 20–29)
Chloride: 105 mmol/L (ref 96–106)
Creatinine, Ser: 0.67 mg/dL (ref 0.57–1.00)
GFR, EST AFRICAN AMERICAN: 130 mL/min/{1.73_m2} (ref >59)
GFR, EST NON AFRICAN AMERICAN: 113 mL/min/{1.73_m2} (ref >59)
Glucose: 86 mg/dL (ref 65–99)
Potassium: 4.3 mmol/L (ref 3.5–5.2)
Sodium: 140 mmol/L (ref 134–144)

## 2017-04-08 ENCOUNTER — Telehealth (INDEPENDENT_AMBULATORY_CARE_PROVIDER_SITE_OTHER): Payer: Self-pay | Admitting: *Deleted

## 2017-04-08 ENCOUNTER — Other Ambulatory Visit: Payer: Self-pay | Admitting: Family Medicine

## 2017-04-08 DIAGNOSIS — D696 Thrombocytopenia, unspecified: Secondary | ICD-10-CM

## 2017-04-08 NOTE — Telephone Encounter (Signed)
-----   Message from Lizbeth BarkMandesia R Hairston, FNP sent at 04/08/2017 11:47 AM EST ----- Platelet count is slightly decreased. You will be referred to hematology. Labs normal. No anemia present.

## 2017-04-08 NOTE — Telephone Encounter (Signed)
Medical Assistant used [employer] to contact patient.  Interpreter Name: Dionicia Interpreter #: [phone] Patient was not available, [employer] left patient a voicemail. Patient is aware of platelet count being decreased and being referred to hematology. Patient is also aware of labs being normal including no anemia.

## 2017-04-09 ENCOUNTER — Encounter: Payer: Self-pay | Admitting: Oncology

## 2017-04-09 ENCOUNTER — Telehealth: Payer: Self-pay | Admitting: Oncology

## 2017-04-09 NOTE — Telephone Encounter (Signed)
Appt has been scheduled for the pt to see Dr. Clelia CroftShadad on 3/12 at 11am. Sent a message to the referring office to notify the pt.

## 2017-04-10 ENCOUNTER — Telehealth (INDEPENDENT_AMBULATORY_CARE_PROVIDER_SITE_OTHER): Payer: Self-pay | Admitting: *Deleted

## 2017-04-10 NOTE — Telephone Encounter (Signed)
Medical Assistant used [employer] to contact patient.  Interpreter Name: Linward Headlandvan Interpreter #: [phone] Patient was not available, [employer] left patient a voicemail. Voicemail states to [facility] [place] [facility] [place] [facility] [place] [facility] [phone]. Patient is aware of heart size being normal, clear lungs and normal pulmonary vessels, patient has no collapsed lung or fluid present.

## 2017-04-10 NOTE — Telephone Encounter (Signed)
-----   Message from Lizbeth BarkMandesia R Hairston, FNP sent at 04/06/2017 10:02 AM EST ----- Heart size is normal. Lungs clear. Pulmonary vessels normal. No fluid or collapsing of lung  present.

## 2017-04-14 ENCOUNTER — Telehealth: Payer: Self-pay | Admitting: Oncology

## 2017-04-14 NOTE — Telephone Encounter (Signed)
Pt's hem appt has been rescheduled to 2:15pm on 3/13 with Dr. Clelia CroftShadad. Message fwd to Arna Mediciora from the referring office to notify the pt.

## 2017-05-05 ENCOUNTER — Encounter: Payer: Self-pay | Admitting: Oncology

## 2017-06-02 ENCOUNTER — Inpatient Hospital Stay: Payer: Self-pay | Attending: Oncology | Admitting: Oncology

## 2017-06-02 ENCOUNTER — Telehealth: Payer: Self-pay | Admitting: Oncology

## 2017-06-02 VITALS — BP 116/69 | HR 62 | Temp 98.5°F | Resp 18 | Ht 62.0 in | Wt 162.3 lb

## 2017-06-02 DIAGNOSIS — D696 Thrombocytopenia, unspecified: Secondary | ICD-10-CM | POA: Insufficient documentation

## 2017-06-02 DIAGNOSIS — F419 Anxiety disorder, unspecified: Secondary | ICD-10-CM | POA: Insufficient documentation

## 2017-06-02 NOTE — Progress Notes (Signed)
Reason for Referral: Thrombocytopenia  HPI: 37 year old woman currently of [place] where she left the last 4 years.  [place] is native [place]  She has no significant comorbid conditions and was noted to have thrombocytopenia on a routine CBC. In February 2019 her platelet count was 123 with a hemoglobin of 12.4 and white cell count of 6.4.  In September 2018 her platelet count was 123 with a normal CBC in the differential.  She had previous counts in 2015 and 2016 within normal range.  She denies any bleeding symptoms including epistaxis, hematochezia or melena.  She does report symptoms of chronic diffuse pain and anxiety.  She has difficulty taking breaths at nighttime but no cough or shortness of breath.  Her appetite and performance status remain excellent.  She does not report any headaches, blurry vision, syncope or seizures. Does not report any fevers, chills or sweats.  Does not report any cough, wheezing or hemoptysis.  Does not report any chest pain, palpitation, orthopnea or leg edema.  Does not report any nausea, vomiting or abdominal pain.  Does not report any constipation or diarrhea.  Does not report any skeletal complaints.    Does not report frequency, urgency or hematuria.  Does not report any skin rashes or lesions. Does not report any heat or cold intolerance.  Does not report any lymphadenopathy or petechiae.  Does not report any anxiety or depression.  Remaining review of systems is negative.   She denies any medical history significant for hypertension, diabetes or coronary disease.  She does not take any medication at this time.    Family History  Problem Relation Age of Onset  . Stroke Mother   . Vision loss Mother   . Hypertension Mother   . Cancer Mother   :  Social History   Socioeconomic History  . Marital status: Married    Spouse name: Not on file  . Number of children: Not on file  . Years of education: Not on file  .  Highest education level: Not on file  Occupational History  . Not on file  Social Needs  . Financial resource strain: Not on file  . Food insecurity:    Worry: Not on file    Inability: Not on file  . Transportation needs:    Medical: Not on file    Non-medical: Not on file  Tobacco Use  . Smoking status: Never Smoker  . Smokeless tobacco: Never Used  Substance and Sexual Activity  . Alcohol use: No  . Drug use: No  . Sexual activity: Yes    Birth control/protection: Implant  Lifestyle  . Physical activity:    Days per week: Not on file    Minutes per session: Not on file  . Stress: Not on file  Relationships  . Social connections:    Talks on phone: Not on file    Gets together: Not on file    Attends religious service: Not on file    Active member of club or organization: Not on file    Attends meetings of clubs or organizations: Not on file    Relationship status: Not on file  . Intimate partner violence:    Fear of current or ex partner: Not on file    Emotionally abused: Not on file    Physically abused: Not on file    Forced sexual activity: Not on file  Other Topics Concern  . Not on file  Social  History Narrative  . Not on file  :  Pertinent items are noted in HPI.  Exam: Blood pressure 116/69, pulse 62, temperature 98.5 F (36.9 C), temperature source Oral, resp. rate 18, height 5\' 2"  (1.575 m), weight 162 lb 4.8 oz (73.6 kg), SpO2 100 %.  ECOG 0 General appearance: alert and cooperative appeared without distress. Head: atraumatic without any abnormalities. Eyes: conjunctivae/corneas clear. PERRL.  Sclera anicteric. Throat: lips, mucosa, and tongue normal; without oral thrush or ulcers. Resp: clear to auscultation bilaterally without rhonchi, wheezes or dullness to percussion. Cardio: regular rate and rhythm, S1, S2 normal, no murmur, click, rub or gallop GI: soft, non-tender; bowel sounds normal; no masses,  no organomegaly Skin: Skin color, texture,  turgor normal. No rashes or lesions Lymph nodes: Cervical, supraclavicular, and axillary nodes normal. Neurologic: Grossly normal without any motor, sensory or deep tendon reflexes. Musculoskeletal: No joint deformity or effusion.  CBC    Component Value Date/Time   WBC 6.4 04/03/2017 1221   WBC 6.6 08/16/2014 1609   RBC 4.55 04/03/2017 1221   RBC 4.55 08/16/2014 1609   HGB 12.4 04/03/2017 1221   HCT 39.2 04/03/2017 1221   PLT 123 (L) 04/03/2017 1221   MCV 86 04/03/2017 1221   MCH 27.3 04/03/2017 1221   MCH 26.8 08/16/2014 1609   MCHC 31.6 04/03/2017 1221   MCHC 31.9 08/16/2014 1609   RDW 13.5 04/03/2017 1221   LYMPHSABS 2.5 08/15/2013 1745   MONOABS 0.3 08/15/2013 1745   EOSABS 0.1 08/15/2013 1745   BASOSABS 0.0 08/15/2013 1745     Assessment and Plan:   37 year old woman with the following issues:  1.  Thrombocytopenia: Mild and chronic in nature with counts close to 123.  Differential diagnosis was discussed today with the patient via interpreter.  This could represent a reactive thrombocytopenia, autoimmune thrombocytopenia or benign fluctuating findings that has no meaningful significance.  Conditions such as MDS, ITP, TTP, HUS or any hematological disorder are extremely unlikely.  She is not taking any medication at this time to explain that either.  I recommended continued observation and surveillance for the time being and repeat counts in 6 months.  No intervention or further workup is needed at this time.  2.  Symptoms ofDifficulty breathing at nighttime: More likely related to anxiety rather than hematological disorder.  I urged her to follow-up with her primary care physician regarding this issue.  3.  Follow-up: We will be in 6 months to repeat a CBC.  30  minutes was spent with the patient face-to-face today.  More than 50% of time was dedicated to patient counseling, education and coordination of her care.

## 2017-06-02 NOTE — Telephone Encounter (Signed)
Scheduled appt per 4/9 los -Gave patient AVS and calender per los.  

## 2017-07-01 ENCOUNTER — Ambulatory Visit: Payer: Self-pay | Admitting: Nurse Practitioner

## 2017-07-01 ENCOUNTER — Ambulatory Visit (INDEPENDENT_AMBULATORY_CARE_PROVIDER_SITE_OTHER): Payer: Self-pay | Admitting: Nurse Practitioner

## 2017-08-11 ENCOUNTER — Encounter: Payer: Self-pay | Admitting: Nurse Practitioner

## 2017-08-11 ENCOUNTER — Ambulatory Visit: Payer: Self-pay | Attending: Nurse Practitioner | Admitting: Nurse Practitioner

## 2017-08-11 VITALS — BP 110/73 | HR 67 | Temp 98.4°F | Ht 62.0 in | Wt 146.6 lb

## 2017-08-11 DIAGNOSIS — D696 Thrombocytopenia, unspecified: Secondary | ICD-10-CM | POA: Insufficient documentation

## 2017-08-11 DIAGNOSIS — Z8249 Family history of ischemic heart disease and other diseases of the circulatory system: Secondary | ICD-10-CM | POA: Insufficient documentation

## 2017-08-11 DIAGNOSIS — F419 Anxiety disorder, unspecified: Secondary | ICD-10-CM | POA: Insufficient documentation

## 2017-08-11 DIAGNOSIS — G8929 Other chronic pain: Secondary | ICD-10-CM

## 2017-08-11 DIAGNOSIS — E559 Vitamin D deficiency, unspecified: Secondary | ICD-10-CM | POA: Insufficient documentation

## 2017-08-11 DIAGNOSIS — M5441 Lumbago with sciatica, right side: Secondary | ICD-10-CM | POA: Insufficient documentation

## 2017-08-11 DIAGNOSIS — E785 Hyperlipidemia, unspecified: Secondary | ICD-10-CM | POA: Insufficient documentation

## 2017-08-11 MED ORDER — ESCITALOPRAM OXALATE 20 MG PO TABS: 20.0000 mg | ORAL_TABLET | Freq: Every day | ORAL | 1 refills | Status: DC

## 2017-08-11 MED ORDER — GABAPENTIN 300 MG PO CAPS: 300.0000 mg | ORAL_CAPSULE | Freq: Three times a day (TID) | ORAL | 3 refills | Status: DC

## 2017-08-11 MED ORDER — HYDROXYZINE HCL 10 MG PO TABS: 10.0000 mg | ORAL_TABLET | Freq: Three times a day (TID) | ORAL | 1 refills | Status: DC | PRN

## 2017-08-11 NOTE — Progress Notes (Addendum)
Assessment & Plan:  [name] was seen today for establish care and leg pain.  Diagnoses and all orders for this visit:  Chronic bilateral low back pain with right-sided sciatica -     gabapentin (NEURONTIN) 300 MG capsule; Take 1 capsule (300 mg total) by mouth 3 (three) times daily. Work on losing weight to help reduce back pain. May alternate with heat and ice application for pain relief. May also alternate with acetaminophen and Ibuprofen as prescribed for back pain. Other alternatives include massage, acupuncture and water aerobics.  You must stay active and avoid a sedentary lifestyle.  Anxiety -     hydrOXYzine (ATARAX/VISTARIL) 10 MG tablet; Take 1 tablet (10 mg total) by mouth 3 (three) times daily as needed for anxiety. -     escitalopram (LEXAPRO) 20 MG tablet; Take 1 tablet (20 mg total) by mouth daily. F/U 3-4 weeks for medication tolerance  Vitamin D deficiency -     VITAMIN D 25 Hydroxy (Vit-D Deficiency, Fractures)  Thrombocytopenia (HCC) -     CBC with Differential Currently being followed by Oncology Next Appointment 12-01-2017   Patient has been counseled on age-appropriate routine health concerns for screening and prevention. These are reviewed and up-to-date. Referrals have been placed accordingly. Immunizations are up-to-date or declined.    Subjective:   Chief Complaint  Patient presents with  . Establish Care    Pt. stated she would like to talk about anxiety.   . Leg Pain    Pt. stated she have a pain sensation when she stretch on her left leg and pain on her feet.    HPI Julia Roy 37 y.o. female presents to office today to establish care. She has a history of anxiety, chronic low back pain with left sided sciatica. VRI was used to communicate directly with patient for the entire encounter including providing detailed patient instructions.    Chronic Low Back Pain She endorses sharp pain radiating pain from her lower back to the right hip and  down to her right leg and foot. 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  She has tried naproxen and ibuprofen in the past with no relief of symptoms. Aggravating factors: None. Relieving Factors: None. Duration: unknown. Pain keeps her up at night.  Lumbar spine x-ray on 11/18/2016 was normal.   Anxiety Patient complains of anxiety disorder.  She has the following symptoms: difficulty concentrating, feelings of losing control, racing thoughts. Onset of symptoms was approximately a few years ago, unchanged since that time. She denies current suicidal and homicidal ideation. Family history significant for no psychiatric illness.Possible organic causes contributing are: none. Risk factors: none Previous treatment includes Lexapro and hydroxyzine.  She complains of the following side effects from the treatment: none.  She was prescribed Lexapro in the past however she reports when she came in to the office for a refill she was instructed that the provider would not refill her prescription at that time.  She would like to restart Lexapro today as she reports it helped her mood and she was not crying or anxious all the time.  She currently denies any suicidal or homicidal ideation. Depression screen [facility] 2/9 08/11/2017  Decreased Interest 0  Down, Depressed, Hopeless 0  PHQ - 2 Score 0  Altered sleeping 0  Tired, decreased energy 0  Change in appetite 0  Feeling bad or failure about yourself  0  Trouble concentrating 0  Moving slowly or fidgety/restless 0  Suicidal thoughts 0  PHQ-9 Score 0   GAD 7 : Generalized Anxiety Score 04/03/2017 11/17/2016 10/17/2016  Nervous, Anxious, on Edge 0 1 2  Control/stop worrying 0 0 1  Worry too much - different things 0 0 1  Trouble relaxing 0 0 1  Restless 0 0 1  Easily annoyed or irritable 0 0 1  Afraid - awful might happen 0 0 1  Total GAD 7 Score 0 1 8    Review of Systems  Constitutional: Negative for fever, malaise/fatigue and weight loss.  HENT:  Negative.  Negative for nosebleeds.   Eyes: Negative.  Negative for blurred vision, double vision and photophobia.  Respiratory: Negative.  Negative for cough and shortness of breath.   Cardiovascular: Negative.  Negative for chest pain, palpitations and leg swelling.  Gastrointestinal: Negative.  Negative for heartburn, nausea and vomiting.  Musculoskeletal: Positive for back pain and myalgias.  Neurological: Positive for tingling and sensory change. Negative for dizziness, focal weakness, seizures and headaches.  Psychiatric/Behavioral: Negative for suicidal ideas. The patient is nervous/anxious.     Past Medical History:  Diagnosis Date  . Anxiety   . Back pain   . Hyperlipidemia     History reviewed. No pertinent surgical history.  Family History  Problem Relation Age of Onset  . Stroke Mother   . Vision loss Mother   . Hypertension Mother   . Cancer Mother   . Diabetes Mother   . Hyperlipidemia Mother     Social History Reviewed with no changes to be made today.   No outpatient medications prior to visit.   No facility-administered medications prior to visit.     No Known Allergies     Objective:    BP 110/73 (BP Location: Right Arm, Patient Position: Sitting, Cuff Size: Normal)   Pulse 67   Temp 98.4 F (36.9 C) (Oral)   Ht 5\' 2"  (1.575 m)   Wt 146 lb 9.6 oz (66.5 kg)   SpO2 96%   BMI 26.81 kg/m  Wt Readings from Last 3 Encounters:  08/11/17 146 lb 9.6 oz (66.5 kg)  06/02/17 162 lb 4.8 oz (73.6 kg)  04/03/17 158 lb 12.8 oz (72 kg)    Physical Exam  Constitutional: She is oriented to person, place, and time. She appears well-developed and well-nourished. She is cooperative.  HENT:  Head: Normocephalic and atraumatic.  Eyes: EOM are normal.  Neck: Normal range of motion.  Cardiovascular: Normal rate, regular rhythm, normal heart sounds and intact distal pulses. Exam reveals no gallop and no friction rub.  No murmur heard. Pulmonary/Chest: Effort  normal and breath sounds normal. No tachypnea. No respiratory distress. She has no decreased breath sounds. She has no wheezes. She has no rhonchi. She has no rales. She exhibits no tenderness.  Abdominal: Bowel sounds are normal.  Musculoskeletal: Normal range of motion. She exhibits no edema, tenderness or deformity.       Lumbar back: She exhibits no pain.  Neurological: She is alert and oriented to person, place, and time. Coordination normal.  Skin: Skin is warm and dry.  Psychiatric: She has a normal mood and affect. Her behavior is normal. Judgment and thought content normal.  Nursing note and vitals reviewed.      Patient has been counseled extensively about nutrition and exercise as well as the importance of adherence with medications and regular follow-up. The patient was given clear instructions to go to ER or return to medical center if symptoms don't improve, worsen or new problems  develop. The patient verbalized understanding.   Follow-up: Return in about 1 month (around 09/08/2017) for anxiety.   Claiborne RiggZelda W Alisa Stjames, FNP-BC [facility] [place], [place] [phone]   08/11/2017, 5:28 PM

## 2017-08-11 NOTE — Patient Instructions (Signed)
Citica  (Sciatica)  La citica es el dolor, la debilidad, el entumecimiento o el hormigueo a lo largo del nervio citico. El nervio citico comienza en la parte inferior de la espalda y desciende por la parte posterior de cada pierna. La citica se produce cuando este nervio se comprime o se ejerce presin sobre l. Suele desaparecer por s sola o con tratamiento. A veces, la citica puede volver a aparecer.  CUIDADOS EN EL HOGAR  Medicamentos   Tome los medicamentos de venta libre y los recetados solamente como se lo haya indicado el mdico.   No conduzca ni use maquinaria pesada mientras toma analgsicos recetados.  Control del dolor   Si se lo indican, aplique hielo sobre la zona afectada.  ? Ponga el hielo en una bolsa plstica.  ? Coloque una toalla entre la piel y la bolsa de hielo.  ? Coloque el hielo durante 20minutos, 2 a 3veces por da.   Despus del hielo, aplique calor sobre la zona afectada antes de hacer ejercicio o con la frecuencia que le haya indicado el mdico. Use la fuente de calor que el mdico le indique, por ejemplo, una compresa de calor hmedo o una almohadilla trmica.  ? Coloque una toalla entre la piel y la fuente de calor.  ? Aplique el calor durante 20 a 30minutos.  ? Retire la fuente de calor si la piel se le pone de color rojo brillante. Esto es muy importante si no puede sentir el dolor, el calor o el fro. Puede correr un riesgo mayor de sufrir quemaduras.  Actividad   Retome sus actividades habituales como se lo haya indicado el mdico. Pregntele al mdico qu actividades son seguras para usted.  ? Evite las actividades que empeoran la citica.   Descanse por breves perodos durante el da. Hgalo recostado o de pie. Esto suele ser mejor que descansar sentado.  ? Cuando descanse durante perodos ms largos, haga alguna actividad fsica o un estiramiento entre los perodos de descanso.  ? Evite estar sentado durante largos perodos sin moverse. Levntese y muvase al  menos una vez cada hora.   Haga ejercicios y estrese con regularidad, como se lo indic el mdico.   No levante nada que pese ms de 10libras (4,5kg) mientras tenga sntomas de citica.  ? Aunque no tenga sntomas, evite levantar objetos pesados.  ? Evite levantar objetos pesados de forma repetida.   Al levantar objetos, hgalos siempre de una forma que sea segura para su cuerpo. Para esto, debe hacer lo siguiente:  ? Flexione las rodillas.  ? Mantenga el objeto cerca del cuerpo.  ? No se tuerza.  Instrucciones generales   Mantenga una buena postura.  ? Evite reclinarse hacia adelante cuando est sentado.  ? Evite encorvar la espalda mientras est de pie.   Mantenga un peso saludable.   Use calzado cmodo, que le d soporte al pie. Evite usar tacones.   Evite dormir sobre un colchn que sea demasiado blando o demasiado duro. Es posible que sienta menos dolor si duerme en un colchn con apoyo suficientemente firme para la espalda.   Concurra a todas las visitas de control como se lo haya indicado el mdico. Esto es importante.  SOLICITE AYUDA SI:   Tiene un dolor con estas caractersticas:  ? Lo despierta cuando est dormido.  ? Empeora al estar recostado.  ? Es peor que el dolor que tena en el pasado.  ? Dura ms de 4semanas.   Pierde peso sin proponrselo.    abdomen (pelvis). ? Los glteos. ? Las piernas.  Siente irritacin o inflamacin en la espalda.  Tiene sensacin de ardor al [employer]oco[employer]ill[employer]. Esta informacin no tiene Theme park manager el consejo del mdico. Asegrese de hacerle al mdico cualquier pregunta que tenga. Document Released: 03/15/2010 Document Revised: 06/04/2015 Document Reviewed: 10/20/2014 Elsevier Interactive Patient Education   2018 [employer].  Vivir con ansiedad Living With Anxiety Despus de haber sido diagnosticado con trastorno de ansiedad, podra sentirse aliviado por comprender por qu se haba sentido o haba actuado de cierto modo. Adems, es natural sentirse abrumado por el tratamiento que tiene por delante y por lo que este significar para su vida. Con atencin y [place], [place] trastorno y Sales executive. Cmo hacer frente a la ansiedad Enfrentar el estrs El estrs es la reaccin del cuerpo ante los cambios y los acontecimientos de la vida, tanto buenos Kelly. El estrs puede durar solo algunas horas o puede ser [place]. El estrs puede influir mucho en la ansiedad, por lo que es importante aprender sobre cmo hacerle frente y cmo pensarlo de un modo nuevo. Hable con el mdico o un orientador psicolgico para obtener ms informacin sobre cmo Museum/gallery exhibitions officer. Podran sugerirle algunas tcnicas para hacerlo, como:  Musicoterapia. Esto podra incluir crear o escuchar msica que disfrute y lo inspire.  Meditacin consciente. Esto implica prestar atencin a la respiracin normal ms que intentar controlarla. Puede realizarse mientras est sentado o camina.  Oracin centrante. Este es un tipo de meditacin que implica centrarse en una palabra, frase o imagen sagrada que le sea representativa y le genere paz.  Respiracin profunda. Para hacer esto, expanda el estmago e inhale lentamente por la nariz. Mantenga el aire durante un lapso de [place]. Luego, exhale lentamente mientras deja que los msculos del estmago se relajen.  Dilogo interno. Se trata de una habilidad por la que usted es capaz de identificar patrones de pensamiento que lo llevan a Warehouse manager reacciones ansiosas y de corregir dichos pensamientos.  Relajacin muscular. Esto implica tensar los msculos y, [place], [place].  Elija una tcnica para reducir el estrs que se adapte a su estilo de vida y su personalidad.  Las tcnicas para reducir el estrs llevan tiempo y Multimedia programmer. Resrvese de 5a106minutos por da para Associate Professor. Algunos terapeutas pueden ofrecerle capacitacin para aprenderlas. Es posible que algunos planes de seguro mdico cubran la capacitacin. Otras cosas que puede hacer para manejar el estrs:  Midwife un registro del estrs. Esto puede ayudarlo a Control and instrumentation engineer estrs y modos de Chief Operating Officer su reaccin.  Pensar en cmo reacciona ante ciertas situaciones. Es posible que no sea capaz de Chief Operating Officer todo, pero puede controlar su reaccin.  Hacerse tiempo para las actividades que lo ayudan a Lexicographer y no sentir culpa por pasar su tiempo de [place].  La terapia en combinacin con las habilidades para enfrentar y reducir el estrs proporciona la mejor alternativa para un tratamiento satisfactorio. Medicamentos Los medicamentos pueden ayudar a [employer]. Algunos medicamentos para la ansiedad:  Programme researcher, broadcasting/film/video ansiedad.  Antidepresivos.  Betabloqueantes.  Es posible que se requieran medicamentos, junto con la terapia, si otros tratamientos no dieron [place]. Un mdico debe recetar los medicamentos. Las [employer] Corporation relaciones interpersonales pueden ser muy importantes para ayudar a su recuperacin. Intente pasar ms tiempo interactuando con amigos y familiares de [place]. Considere la posibilidad de ir a terapia de pareja, tomar clases de educacin familiar o ir a Information systems manager. La terapia puede ayudarlos  a usted y a los dems a Chief Technology Officercomprender mejor el trastorno. Cmo reconocer cambios en el trastorno Todos tienen una respuesta diferente al tratamiento de la ansiedad. Se dice que est recuperado de la ansiedad cuando los sntomas disminuyen y dejan de Producer, television/film/videointerferir en las actividades diarias en el hogar o Navarreel trabajo. Esto podra significar que usted comenzar a Radio producerhacer lo siguiente:  DealerTener mejor concentracin y atencin.  Dormir mejor.  Estar menos  irritable.  Tener ms energa.  Tener Progress Energymejor memoria.  Es Public librarianimportante reconocer cundo el trastorno [place]empeora. Comunquese con el mdico si sus sntomas interfieren en su hogar o su trabajo, y usted no siente que el trastorno est mejorando. Dnde encontrar ayuda y apoyo: Puede conseguir ayuda y M.D.C. Holdingsapoyo en los siguientes lugares:  Grupos de Harlanautoayuda.  Organizaciones comunitarias y en lnea.  Un lder espiritual de confianza.  Terapia de pareja.  Clases de educacin familiar.  Terapia familiar.  Siga estas instrucciones en su casa:  Consuma una dieta saludable que incluya abundantes frutas, verduras, cereales integrales, productos lcteos descremados y protenas magras. No consuma muchos alimentos con alto contenido de grasas slidas, azcares agregados o sal.  Actividad fsica. La Harley-Davidsonmayora de los adultos debe hacer lo siguiente: ? Education officer, environmentalealizar, al [place]menos, 150minutos de actividad fsica por semana. El ejercicio debe aumentar la frecuencia cardaca y Media plannerhacerlo transpirar (ejercicio de intensidad moderada). ? Realizar ejercicios de fortalecimiento por lo Rite Aidmenos dos veces por semana.  Disminuir el consumo de cafena, tabaco, alcohol y otras sustancias potencialmente dainas.  Dormir el tiempo adecuado y de Network engineerla forma correcta. La Harley-Davidsonmayora de los adultos necesitan entre 7y9horas de sueo todas las noches.  Opte por cosas que le simplifiquen la vida.  Tome los medicamentos de venta libre y los recetados solamente como se lo haya indicado el mdico.  Evite el consumo de cafena, alcohol y ciertos medicamentos contra el resfro de venta sin receta. Estos podran Optician, dispensinghacerlo sentir peor. Pregntele al farmacutico qu medicamentos no debera tomar.  Concurra a todas las visitas de control como se lo haya indicado el mdico. Esto es importante. Preguntas para hacerle al mdico  Me ser til la terapia?  Con qu frecuencia debo visitar a un mdico para el seguimiento?  Durante cunto tiempo  tendr TXU Corpque tomar los medicamentos?  Tienen efectos secundarios a TRW Automotivelargo plazo los medicamentos que tomo?  Existe una alternativa que remplace los medicamentos? Comunquese con un mdico si:  Le resulta difcil permanecer concentrado o finalizar las tareas diarias.  Pasa muchas horas por da sintindose preocupado por la vida cotidiana.  La preocupacin le provoca un cansancio extremo.  Comienza a tener dolores de Turkmenistancabeza o nuseas, o a sentirse tenso.  Orina ms de lo normal.  Tiene diarrea. Solicite ayuda de inmediato si:  Se le acelera la frecuencia cardaca y Games developerle cuesta respirar.  Tiene pensamientos acerca de Runner, broadcasting/film/videolastimarse o daar a Economistotras personas. Si alguna vez siente que puede lastimarse o Physicist, medicallastimar a los dems, o tiene pensamientos de poner fin a su vida, busque ayuda de inmediato. Puede dirigirse al servicio de urgencias ms cercano o comunicarse con:  El servicio de Sports administratoremergencias de su localidad (911 en los [place]).  Una lnea de asistencia al suicida y Visual merchandiseratencin en crisis, como la Murphy OilLnea Nacional de Prevencin del Suicidio (National Suicide Prevention Lifeline) al [phone]. Est disponible las 24 horas del da.  Resumen  Tomar medidas para enfrentar el estrs puede calmarlo.  Los medicamentos no pueden curar los trastornos de [place] Villageansiedad, Biomedical engineerpero pueden ayudar a Paramedicaliviar  los sntomas.  Los familiares, los amigos y las parejas pueden tener un lugar importante en su recuperacin del trastorno de ansiedad. Esta informacin no tiene Theme park manager el consejo del mdico. Asegrese de hacerle al mdico cualquier pregunta que tenga. Document Released: 05/20/2016 Document Revised: 05/20/2016 Document Reviewed: 05/20/2016 Elsevier Interactive Patient Education  [employer].

## 2017-08-12 LAB — CBC WITH DIFFERENTIAL/PLATELET
BASOS ABS: 0 10*3/uL (ref 0.0–0.2)
Basos: 0 %
EOS (ABSOLUTE): 0.2 10*3/uL (ref 0.0–0.4)
Eos: 2 %
HEMOGLOBIN: 12.3 g/dL (ref 11.1–15.9)
Hematocrit: 37.7 % (ref 34.0–46.6)
Immature Grans (Abs): 0 10*3/uL (ref 0.0–0.1)
Immature Granulocytes: 0 %
LYMPHS: 41 %
Lymphocytes Absolute: 2.9 10*3/uL (ref 0.7–3.1)
MCH: 27.5 pg (ref 26.6–33.0)
MCHC: 32.6 g/dL (ref 31.5–35.7)
MCV: 84 fL (ref 79–97)
MONOCYTES: 5 %
Monocytes Absolute: 0.4 10*3/uL (ref 0.1–0.9)
Neutrophils Absolute: 3.7 10*3/uL (ref 1.4–7.0)
Neutrophils: 52 %
PLATELETS: 127 10*3/uL — AB (ref 150–450)
RBC: 4.48 x10E6/uL (ref 3.77–5.28)
RDW: 13.6 % (ref 12.3–15.4)
WBC: 7.2 10*3/uL (ref 3.4–10.8)

## 2017-08-12 LAB — VITAMIN D 25 HYDROXY (VIT D DEFICIENCY, FRACTURES): VIT D 25 HYDROXY: 18.7 ng/mL — AB (ref 30.0–100.0)

## 2017-08-14 NOTE — Progress Notes (Signed)
Pt. Would like to know if it is vitamin D otc she will need to purchase?

## 2017-08-19 ENCOUNTER — Other Ambulatory Visit: Payer: Self-pay | Admitting: Nurse Practitioner

## 2017-08-19 MED ORDER — VITAMIN D (ERGOCALCIFEROL) 1.25 MG (50000 UNIT) PO CAPS: 50000.0000 [IU] | ORAL_CAPSULE | ORAL | 1 refills | Status: DC

## 2017-08-20 ENCOUNTER — Telehealth: Payer: Self-pay

## 2017-08-20 NOTE — Telephone Encounter (Signed)
CMA attempt to call patient to inform lab results.  No answer and left a VM for patient to call back.  If patient call back, please inform:  Vitamin D is low. Will need to take weekly vitamin d for the next 3 months. Platelets have improved but still low. Continue to follow up with oncology as instructed [facility]

## 2017-08-20 NOTE — Telephone Encounter (Signed)
-----   Message from Claiborne RiggZelda W Fleming, NP sent at 08/12/2017  8:00 PM EDT ----- Vitamin D is low. Will need to take weekly vitamin d for the next 3 months. Platelets have improved but still low. Continue to follow up with oncology as instructed

## 2017-08-21 ENCOUNTER — Ambulatory Visit: Payer: Self-pay | Attending: Nurse Practitioner

## 2017-08-24 ENCOUNTER — Telehealth: Payer: Self-pay | Admitting: Nurse Practitioner

## 2017-08-24 NOTE — Telephone Encounter (Signed)
Patient came in to renew OC, would like a referral to the dental clinic.

## 2017-08-24 NOTE — Telephone Encounter (Signed)
Will route to PCP 

## 2017-08-25 NOTE — Telephone Encounter (Signed)
Has the orange card been approved?

## 2017-09-11 ENCOUNTER — Encounter: Payer: Self-pay | Admitting: Nurse Practitioner

## 2017-09-11 ENCOUNTER — Ambulatory Visit: Payer: Self-pay | Attending: Nurse Practitioner | Admitting: Nurse Practitioner

## 2017-09-11 VITALS — BP 102/68 | HR 60 | Temp 98.7°F | Ht 62.0 in | Wt 166.8 lb

## 2017-09-11 DIAGNOSIS — F419 Anxiety disorder, unspecified: Secondary | ICD-10-CM | POA: Insufficient documentation

## 2017-09-11 DIAGNOSIS — Z79899 Other long term (current) drug therapy: Secondary | ICD-10-CM | POA: Insufficient documentation

## 2017-09-11 DIAGNOSIS — M25551 Pain in right hip: Secondary | ICD-10-CM | POA: Insufficient documentation

## 2017-09-11 DIAGNOSIS — Z833 Family history of diabetes mellitus: Secondary | ICD-10-CM | POA: Insufficient documentation

## 2017-09-11 DIAGNOSIS — E785 Hyperlipidemia, unspecified: Secondary | ICD-10-CM | POA: Insufficient documentation

## 2017-09-11 DIAGNOSIS — R200 Anesthesia of skin: Secondary | ICD-10-CM | POA: Insufficient documentation

## 2017-09-11 DIAGNOSIS — F329 Major depressive disorder, single episode, unspecified: Secondary | ICD-10-CM | POA: Insufficient documentation

## 2017-09-11 DIAGNOSIS — G8929 Other chronic pain: Secondary | ICD-10-CM | POA: Insufficient documentation

## 2017-09-11 DIAGNOSIS — M545 Low back pain: Secondary | ICD-10-CM | POA: Insufficient documentation

## 2017-09-11 DIAGNOSIS — R0602 Shortness of breath: Secondary | ICD-10-CM | POA: Insufficient documentation

## 2017-09-11 DIAGNOSIS — Z823 Family history of stroke: Secondary | ICD-10-CM | POA: Insufficient documentation

## 2017-09-11 DIAGNOSIS — Z8249 Family history of ischemic heart disease and other diseases of the circulatory system: Secondary | ICD-10-CM | POA: Insufficient documentation

## 2017-09-11 MED ORDER — ALBUTEROL SULFATE HFA 108 (90 BASE) MCG/ACT IN AERS: 2.0000 | INHALATION_SPRAY | Freq: Four times a day (QID) | RESPIRATORY_TRACT | 2 refills | Status: DC | PRN

## 2017-09-11 NOTE — Progress Notes (Addendum)
Assessment & Plan:  Julia Roy was seen today for follow-up and back pain.  Diagnoses and all orders for this visit:  Right hip pain -     DG HIP UNILAT WITH PELVIS 2-3 VIEWS RIGHT; Future  Shortness of breath -     albuterol (PROVENTIL HFA;VENTOLIN HFA) 108 (90 Base) MCG/ACT inhaler; Inhale 2 puffs into the lungs every 6 (six) hours as needed for wheezing or shortness of breath  Patient has been counseled on age-appropriate routine health concerns for screening and prevention. These are reviewed and up-to-date. Referrals have been placed accordingly. Immunizations are up-to-date or declined.    Subjective:   Chief Complaint  Patient presents with  . Follow-up    Pt. is here to follow-up on anxiety.  . Back Pain    Pt. stated she have a pain shooting on her spine, and a pain sensation on the front on her right leg.    HPI Julia Roy 37 y.o. female presents to office today for follow up of anxiety and depression. VRI was used to communicate directly with patient for the entire encounter including providing detailed patient instructions.    Anxiety and Depression Depression is well controlled with Lexapro 20 mg. However she continues to endorse left sided intermittent dyspnea. She has a history of chronic left sided chest wall pain with 2 previous negative chest xrays. Her symptoms are worse with household chores and increased physical activity.  States she has to stop to catch her breath and try to slow her breathing down. I do not feel her symptoms are cardiac in nature. I have instructed her to increase her hydroxyzine to 20mg  and also have added on an albuterol inhaler. She has chronic anxiety with onset several years ago.   Chronic Low Back Pain She was prescribed gabapentin for right sided sciatica at her last office visit. She endorses little relief of pain with taking gabapentin. Now endorsing right hip pain with radiating tingling and pain to the right lower leg and  numbness in her right foot. There are no aggravating factors. Relieving factors: NONE. Lumbar Spine xray 11-18-2016: NORMAL  Review of Systems  Constitutional: Negative for fever, malaise/fatigue and weight loss.  HENT: Negative.  Negative for nosebleeds.   Eyes: Negative.  Negative for blurred vision, double vision and photophobia.  Respiratory: Positive for shortness of breath. Negative for cough.   Cardiovascular: Negative.  Negative for chest pain, palpitations and leg swelling.  Gastrointestinal: Negative.  Negative for heartburn, nausea and vomiting.  Musculoskeletal: Positive for back pain. Negative for falls and myalgias.  Neurological: Negative.  Negative for dizziness, focal weakness, seizures and headaches.  Psychiatric/Behavioral: Negative for suicidal ideas. The patient is nervous/anxious.        Panic attacks    Past Medical History:  Diagnosis Date  . Anxiety   . Back pain   . Hyperlipidemia     History reviewed. No pertinent surgical history.  Family History  Problem Relation Age of Onset  . Stroke Mother   . Vision loss Mother   . Hypertension Mother   . Cancer Mother   . Diabetes Mother   . Hyperlipidemia Mother     Social History Reviewed with no changes to be made today.   Outpatient Medications Prior to Visit  Medication Sig Dispense Refill  . escitalopram (LEXAPRO) 20 MG tablet Take 1 tablet (20 mg total) by mouth daily. 30 tablet 1  . gabapentin (NEURONTIN) 300 MG capsule Take 1 capsule (300 mg total) by  mouth 3 (three) times daily. 90 capsule 3  . hydrOXYzine (ATARAX/VISTARIL) 10 MG tablet Take 1 tablet (10 mg total) by mouth 3 (three) times daily as needed for anxiety. 60 tablet 1  . Vitamin D, Ergocalciferol, (DRISDOL) 50000 units CAPS capsule Take 1 capsule (50,000 Units total) by mouth every 7 (seven) days. 12 capsule 1   No facility-administered medications prior to visit.     No Known Allergies     Objective:    BP 102/68 (BP Location:  Left Arm, Patient Position: Sitting, Cuff Size: Normal)   Pulse 60   Temp 98.7 F (37.1 C) (Oral)   Ht 5\' 2"  (1.575 m)   Wt 166 lb 12.8 oz (75.7 kg)   SpO2 96%   BMI 30.51 kg/m  Wt Readings from Last 3 Encounters:  09/11/17 166 lb 12.8 oz (75.7 kg)  08/11/17 146 lb 9.6 oz (66.5 kg)  06/02/17 162 lb 4.8 oz (73.6 kg)    Physical Exam  Constitutional: She is oriented to person, place, and time. She appears well-developed and well-nourished. She is cooperative.  HENT:  Head: Normocephalic and atraumatic.  Eyes: EOM are normal.  Neck: Normal range of motion.  Cardiovascular: Normal rate, regular rhythm, normal heart sounds and intact distal pulses. Exam reveals no gallop and no friction rub.  No murmur heard. Pulmonary/Chest: Effort normal and breath sounds normal. No tachypnea. No respiratory distress. She has no decreased breath sounds. She has no wheezes. She has no rhonchi. She has no rales. She exhibits no tenderness.  Abdominal: Bowel sounds are normal.  Musculoskeletal: Normal range of motion. She exhibits no edema.       Right hip: She exhibits tenderness. She exhibits no bony tenderness and no swelling.       Legs: Neurological: She is alert and oriented to person, place, and time. Coordination normal.  Skin: Skin is warm and dry.  Psychiatric: She has a normal mood and affect. Her behavior is normal. Judgment and thought content normal.  Nursing note and vitals reviewed.        Patient has been counseled extensively about nutrition and exercise as well as the importance of adherence with medications and regular follow-up. The patient was given clear instructions to go to ER or return to medical center if symptoms don't improve, worsen or new problems develop. The patient verbalized understanding.   Follow-up: Return in about 3 weeks (around 10/02/2017) for right hip pain.   Claiborne RiggZelda W Fleming, FNP-BC [facility] [place] ,  [place] [phone]   09/11/2017, 11:52 PM

## 2017-09-11 NOTE — Patient Instructions (Addendum)
Citica  (Sciatica)  La citica es el dolor, la debilidad, el entumecimiento o el hormigueo a lo largo del nervio citico. El nervio citico comienza en la parte inferior de la espalda y desciende por la parte posterior de cada pierna. La citica se produce cuando este nervio se comprime o se ejerce presin sobre l. Suele desaparecer por s sola o con tratamiento. A veces, la citica puede volver a aparecer.  CUIDADOS EN EL HOGAR  Medicamentos   Tome los medicamentos de venta libre y los recetados solamente como se lo haya indicado el mdico.   No conduzca ni use maquinaria pesada mientras toma analgsicos recetados.  Control del dolor   Si se lo indican, aplique hielo sobre la zona afectada.  ? Ponga el hielo en una bolsa plstica.  ? Coloque una toalla entre la piel y la bolsa de hielo.  ? Coloque el hielo durante 20minutos, 2 a 3veces por da.   Despus del hielo, aplique calor sobre la zona afectada antes de hacer ejercicio o con la frecuencia que le haya indicado el mdico. Use la fuente de calor que el mdico le indique, por ejemplo, una compresa de calor hmedo o una almohadilla trmica.  ? Coloque una toalla entre la piel y la fuente de calor.  ? Aplique el calor durante 20 a 30minutos.  ? Retire la fuente de calor si la piel se le pone de color rojo brillante. Esto es muy importante si no puede sentir el dolor, el calor o el fro. Puede correr un riesgo mayor de sufrir quemaduras.  Actividad   Retome sus actividades habituales como se lo haya indicado el mdico. Pregntele al mdico qu actividades son seguras para usted.  ? Evite las actividades que empeoran la citica.   Descanse por breves perodos durante el da. Hgalo recostado o de pie. Esto suele ser mejor que descansar sentado.  ? Cuando descanse durante perodos ms largos, haga alguna actividad fsica o un estiramiento entre los perodos de descanso.  ? Evite estar sentado durante largos perodos sin moverse. Levntese y muvase al  menos una vez cada hora.   Haga ejercicios y estrese con regularidad, como se lo indic el mdico.   No levante nada que pese ms de 10libras (4,5kg) mientras tenga sntomas de citica.  ? Aunque no tenga sntomas, evite levantar objetos pesados.  ? Evite levantar objetos pesados de forma repetida.   Al levantar objetos, hgalos siempre de una forma que sea segura para su cuerpo. Para esto, debe hacer lo siguiente:  ? Flexione las rodillas.  ? Mantenga el objeto cerca del cuerpo.  ? No se tuerza.  Instrucciones generales   Mantenga una buena postura.  ? Evite reclinarse hacia adelante cuando est sentado.  ? Evite encorvar la espalda mientras est de pie.   Mantenga un peso saludable.   Use calzado cmodo, que le d soporte al pie. Evite usar tacones.   Evite dormir sobre un colchn que sea demasiado blando o demasiado duro. Es posible que sienta menos dolor si duerme en un colchn con apoyo suficientemente firme para la espalda.   Concurra a todas las visitas de control como se lo haya indicado el mdico. Esto es importante.  SOLICITE AYUDA SI:   Tiene un dolor con estas caractersticas:  ? Lo despierta cuando est dormido.  ? Empeora al estar recostado.  ? Es peor que el dolor que tena en el pasado.  ? Dura ms de 4semanas.   Pierde peso sin proponrselo.    abdomen (pelvis). ? Los glteos. ? Las piernas.  Siente irritacin o inflamacin en la espalda.  Tiene sensacin de ardor al ConocoPhillipsorinar. Esta informacin no tiene Theme park managercomo fin reemplazar el consejo del mdico. Asegrese de hacerle al mdico cualquier pregunta que tenga. Document Released: 03/15/2010 Document Revised: 06/04/2015 Document Reviewed: 10/20/2014 Elsevier Interactive Patient Education   2018 [employer]  Crisis de angustia (Panic Attacks) Las crisis de [place] son sensaciones repentinas y Music therapistbreves de mucho miedo o Dentistmalestar. Es posible que experimente estas sensaciones sin ningn motivo, cuando est relajado, preocupado (ansioso) o cuando duerme. CUIDADOS EN EL HOGAR  Tome todos los medicamentos segn las indicaciones.  Consulte a su mdico antes de comenzar a tomar nuevos medicamentos.  Cumpla con los controles mdicos segn las indicaciones.  SOLICITE AYUDA SI:  No puede tomar los Monsanto Companymedicamentos como se lo han indicado.  Los sntomas no mejoran.  Los sntomas empeoran.  SOLICITE AYUDA DE INMEDIATO SI:  Sus crisis parecen diferentes de las habituales.  Tiene pensamientos acerca de Runner, broadcasting/film/videolastimarse o daar a Economistotras personas.  Toma un medicamento para las crisis de [place] y presenta efectos secundarios.  ASEGRESE DE QUE:  Comprende estas instrucciones.  Controlar su afeccin.  Recibir ayuda de inmediato si no mejora o si empeora.  Esta informacin no tiene Theme park managercomo fin reemplazar el consejo del mdico. Asegrese de hacerle al mdico cualquier pregunta que tenga. Document Released: 05/28/2010 Document Revised: 12/01/2012 Document Reviewed: 09/24/2012 Elsevier Interactive Patient Education  2017 [employer].

## 2017-09-12 ENCOUNTER — Encounter: Payer: Self-pay | Admitting: Nurse Practitioner

## 2017-09-14 ENCOUNTER — Ambulatory Visit (HOSPITAL_COMMUNITY)
Admission: RE | Admit: 2017-09-14 | Discharge: 2017-09-14 | Disposition: A | Discharge status: Home / Self Care | Payer: Self-pay | Source: Ambulatory Visit | Attending: Nurse Practitioner | Admitting: Nurse Practitioner

## 2017-09-14 DIAGNOSIS — M25551 Pain in right hip: Secondary | ICD-10-CM | POA: Insufficient documentation

## 2017-09-17 ENCOUNTER — Telehealth: Payer: Self-pay

## 2017-09-17 NOTE — Telephone Encounter (Signed)
-----   Message from Hoy RegisterEnobong Newlin, MD sent at 09/15/2017  8:08 AM EDT ----- X-ray is negative for fracture or abnormality.

## 2017-09-17 NOTE — Telephone Encounter (Signed)
CMA attempt to call patient to inform on results.  No answer and left a VM for patient to call back.  If patient call back, please inform:  X-ray is negative for fracture or abnormality.

## 2017-12-01 ENCOUNTER — Inpatient Hospital Stay: Payer: Self-pay

## 2017-12-01 ENCOUNTER — Inpatient Hospital Stay: Payer: Self-pay | Attending: Oncology | Admitting: Oncology

## 2017-12-01 VITALS — BP 105/81 | HR 49 | Temp 97.7°F | Resp 17 | Ht 62.0 in | Wt 167.1 lb

## 2017-12-01 DIAGNOSIS — D696 Thrombocytopenia, unspecified: Secondary | ICD-10-CM

## 2017-12-01 DIAGNOSIS — Z79899 Other long term (current) drug therapy: Secondary | ICD-10-CM | POA: Insufficient documentation

## 2017-12-01 LAB — CBC WITH DIFFERENTIAL (CANCER CENTER ONLY)
BASOS ABS: 0 10*3/uL (ref 0.0–0.1)
Basophils Relative: 1 %
Eosinophils Absolute: 0.2 10*3/uL (ref 0.0–0.5)
Eosinophils Relative: 3 %
HEMATOCRIT: 38.9 % (ref 34.8–46.6)
HEMOGLOBIN: 12.6 g/dL (ref 12.0–15.0)
LYMPHS PCT: 39 %
Lymphs Abs: 2.4 10*3/uL (ref 0.9–3.3)
MCH: 27.2 pg (ref 25.1–34.0)
MCHC: 32.4 g/dL (ref 31.5–36.0)
MCV: 83.8 fL (ref 79.5–101.0)
Monocytes Absolute: 0.4 10*3/uL (ref 0.1–0.9)
Monocytes Relative: 6 %
NEUTROS ABS: 3.2 10*3/uL (ref 1.5–6.5)
NEUTROS PCT: 51 %
NRBC: 0 % (ref 0.0–0.2)
Platelet Count: 150 10*3/uL (ref 150–400)
RBC: 4.64 MIL/uL (ref 3.70–5.45)
RDW: 13.2 % (ref 11.2–14.5)
WBC Count: 6.2 10*3/uL (ref 4.0–10.5)

## 2017-12-01 NOTE — Progress Notes (Signed)
Hematology and Oncology Follow Up Visit  Julia Roy [id number] 1980/09/15 37 y.o. 12/01/2017 9:39 AM Julia Roy, NPH[place], [place], F*   Principle Diagnosis: 37 year old woman with thrombocytopenia noted in 2018.  Her platelet count is above 100,000 and appears to be chronic and fluctuating in nature.    Current therapy: Active surveillance  Interim History: Julia Roy presents today for a follow-up visit.  Since her last visit, she reports no major changes in her health.  She continues to be active and attends activities of daily living.  She denies any recent anxiety issues and no longer taking any medication for it.  She denies any easy bruising, hematochezia or melena.  She denies any epistaxis.  Her performance status and quality of life remain excellent.  She does not report any headaches, blurry vision, syncope or seizures.  She denies any alteration in mental status or dizziness.  Does not report any fevers, chills or sweats.  Does not report any cough, wheezing or hemoptysis.  Does not report any chest pain, palpitation, orthopnea or leg edema.  Does not report any nausea, vomiting or abdominal pain.  Does not report any constipation or diarrhea.  Does not report any skeletal complaints.    Does not report frequency, urgency or hematuria.  Does not report any skin rashes or lesions.   Does not report any lymphadenopathy or petechiae.  Does not report any mood changes.  Remaining review of systems is negative.    Medications: I have reviewed the patient's current medications.  Current Outpatient Medications  Medication Sig Dispense Refill  . albuterol (PROVENTIL HFA;VENTOLIN HFA) 108 (90 Base) MCG/ACT inhaler Inhale 2 puffs into the lungs every 6 (six) hours as needed for wheezing or shortness of breath. 1 Inhaler 2  . escitalopram (LEXAPRO) 20 MG tablet Take 1 tablet (20 mg total) by mouth daily. 30 tablet 1  . gabapentin (NEURONTIN) 300 MG capsule Take 1 capsule (300  mg total) by mouth 3 (three) times daily. 90 capsule 3  . hydrOXYzine (ATARAX/VISTARIL) 10 MG tablet Take 1 tablet (10 mg total) by mouth 3 (three) times daily as needed for anxiety. 60 tablet 1  . Vitamin D, Ergocalciferol, (DRISDOL) 50000 units CAPS capsule Take 1 capsule (50,000 Units total) by mouth every 7 (seven) days. 12 capsule 1   No current facility-administered medications for this visit.      Allergies: No Known Allergies  Past Medical History, Surgical history, Social history, and Family History were reviewed and updated.    Physical Exam: Blood pressure 105/81, pulse (!) 49, temperature 97.7 F (36.5 C), temperature source Other (Comment), resp. rate 17, height 5\' 2"  (1.575 m), weight 167 lb 1.6 oz (75.8 kg), SpO2 100 %.   ECOG:  General appearance: alert and cooperative appeared without distress. Head: Normocephalic, without obvious abnormality Oropharynx: No oral thrush or ulcers. Eyes: No scleral icterus.  Pupils are equal and round reactive to light. Lymph nodes: Cervical, supraclavicular, and axillary nodes normal. Heart:regular rate and rhythm, S1, S2 normal, no murmur, click, rub or gallop Lung:chest clear, no wheezing, rales, normal symmetric air entry Abdomin: soft, non-tender, without masses or organomegaly. Neurological: No motor, sensory deficits.  Intact deep tendon reflexes. Skin: No rashes or lesions.  No ecchymosis or petechiae. Musculoskeletal: No joint deformity or effusion. Psychiatric: Mood and affect are appropriate.    Lab Results: Lab Results  Component Value Date   WBC 7.2 08/11/2017   HGB 12.3 08/11/2017   HCT 37.7 08/11/2017  MCV 84 08/11/2017   PLT 127 (L) 08/11/2017     Chemistry      Component Value Date/Time   NA 140 04/03/2017 1221   K 4.3 04/03/2017 1221   CL 105 04/03/2017 1221   CO2 21 04/03/2017 1221   BUN 10 04/03/2017 1221   CREATININE 0.67 04/03/2017 1221   CREATININE 0.61 08/15/2013 1745      Component Value  Date/Time   CALCIUM 9.3 04/03/2017 1221   ALKPHOS 72 11/17/2016 1557   AST 20 11/17/2016 1557   ALT 12 11/17/2016 1557   BILITOT <0.2 11/17/2016 1557       Impression and Plan:  37 year old woman with:  1.  Thrombocytopenia: This has been noted since 2018 with platelet count in the 120 range.  Her CBC otherwise is normal.  The differential diagnosis was reviewed again which include reactive thrombocytopenia, autoimmune etiology versus medication.  Hematological conditions such as MDS, leukemia or lymphoproliferative disorder are considered unlikely.  Repeat his CBC from today was reviewed and showed a platelet count of 150,000.  Based on these findings, she has minor fluctuations in her platelet count that do not indicate a blood disorder.  At this time, I do not recommend any further evaluation.     2.  Follow-up: We will be as needed in the future.  15 minutes was spent with the patient face-to-face today.  More than 50% of time was dedicated to reviewing laboratory data, differential diagnosis and future plan of care.     Eli Hose, MD 10/8/20199:39 AM

## 2017-12-02 ENCOUNTER — Telehealth: Payer: Self-pay

## 2017-12-02 NOTE — Telephone Encounter (Signed)
Error in los date in previous documentation Per 10/8 no los

## 2017-12-02 NOTE — Telephone Encounter (Signed)
Per 10/9 no los 

## 2018-10-19 ENCOUNTER — Encounter: Payer: Self-pay | Admitting: Nurse Practitioner

## 2018-10-19 ENCOUNTER — Other Ambulatory Visit: Payer: Self-pay

## 2018-10-19 ENCOUNTER — Ambulatory Visit: Payer: Self-pay | Attending: Nurse Practitioner | Admitting: Nurse Practitioner

## 2018-10-19 DIAGNOSIS — F329 Major depressive disorder, single episode, unspecified: Secondary | ICD-10-CM

## 2018-10-19 DIAGNOSIS — F419 Anxiety disorder, unspecified: Secondary | ICD-10-CM

## 2018-10-19 DIAGNOSIS — M25551 Pain in right hip: Secondary | ICD-10-CM

## 2018-10-19 NOTE — Progress Notes (Signed)
Virtual Visit via Telephone Note Due to national recommendations of social distancing due to COVID 19, telehealth visit is felt to be most appropriate for this patient at this time.  I discussed the limitations, risks, security and privacy concerns of performing an evaluation and management service by telephone and the availability of in person appointments. I also discussed with the patient that there may be a patient responsible charge related to this service. The patient expressed understanding and agreed to proceed.    I connected with Felipa EthGuadalupe Torres on 10/19/18  at   3:10 PM EDT  EDT by telephone and verified that I am speaking with the correct person using two identifiers.   Consent I discussed the limitations, risks, security and privacy concerns of performing an evaluation and management service by telephone and the availability of in person appointments. I also discussed with the patient that there may be a patient responsible charge related to this service. The patient expressed understanding and agreed to proceed.   Location of Patient: Private Residence    [facility]    Persons participating in Telemedicine visit: Bertram DenverZelda Storie Heffern FNP-BC YY AscutneyBien CMA Victorio PalmGuadalupe Elayne Guerinorres  Laura Spanish Interpreter [phone]   History of Present Illness: Telemedicine visit for: Re Establish care  has a past medical history of Anxiety, Back pain, Hyperlipidemia, and Thrombocytopenia (HCC).  PER ONCOLOGY 12-01-2017 1. Thrombocytopenia: This has been noted since 2018 with platelet count in the 120 range.  Her CBC otherwise is normal. The differential diagnosis was reviewed again which include reactive thrombocytopenia, autoimmune etiology versus medication. Hematological conditions such as MDS, leukemia or lymphoproliferative disorder are considered unlikely.  Repeat CBC from today was reviewed and showed a platelet count of 150,000.  Based on  these findings, she has minor fluctuations in her platelet count that do not indicate a blood disorder.  At this time, I do not recommend any further evaluation.  2. Follow-up: We will be as needed in the future.   Right Hip Pain Chronic. Pain radiates down into the right lower leg and foot.   Described as painful, sharp and tingling sensation. She has tried gabapentin (made her sleepy), naproxen, ibuprofen and flexeril. She states neither of these medications helped to relieve her pain. Aggravating factors: Standing or sitting for prolonged periods of time. Heat helps her feel more comfortable but the pain does not go away.    Anxiety and Depression  She stopped taking lexapro or hydroxyzine. Patient complains of anxiety disorder.  She has the following symptoms: difficulty concentrating, feelings of losing control, racing thoughts. Onset of symptoms was approximately a few years ago, unchanged since that time. She denies current suicidal and homicidal ideation. Family history significant for no psychiatric illness.Possible organic causes contributing are: none. Risk factors: none Previous treatment includes Lexapro and hydroxyzine.  She complains of the following side effects from the treatment: none.  She was prescribed Lexapro in the past however she reports when she came in to the office for a refill she was instructed that the provider would not refill her prescription at that time.  She would like to restart Lexapro today as she reports it helped her mood and she was not crying or anxious all the time.  She currently denies any suicidal or homicidal ideation. [facility] 10/19/2018 09/11/2017 08/11/2017 04/03/2017 12/18/2016  Decreased Interest 0 0 0 0 0  Down, Depressed, Hopeless 1 0 0 0 0  PHQ - 2 Score 1 0  0 0 0  Altered sleeping 1 0 0 0 -  Tired, decreased energy 1 0 0 1 -  Change in appetite 1 0 0 0 -  Feeling bad or failure about yourself  0 0 0 0 -  Trouble concentrating 0  0 0 0 -  Moving slowly or fidgety/restless 0 0 0 0 -  Suicidal thoughts 0 0 0 0 -  PHQ-9 Score 4 0 0 1 -   GAD 7 : Generalized Anxiety Score 10/19/2018 04/03/2017 11/17/2016 10/17/2016  Nervous, Anxious, on Edge 1 0 1 2  Control/stop worrying 1 0 0 1  Worry too much - different things 1 0 0 1  Trouble relaxing 0 0 0 1  Restless 0 0 0 1  Easily annoyed or irritable 1 0 0 1  Afraid - awful might happen 0 0 0 1  Total GAD 7 Score 4 0 1 8     Past Medical History:  Diagnosis Date  . Anxiety   . Back pain   . Hyperlipidemia   . Thrombocytopenia (HCC)     History reviewed. No pertinent surgical history.  Family History  Problem Relation Age of Onset  . Stroke Mother   . Vision loss Mother   . Hypertension Mother   . Cancer Mother   . Diabetes Mother   . Hyperlipidemia Mother     Social History   Socioeconomic History  . Marital status: Married    Spouse name: Not on file  . Number of children: Not on file  . Years of education: Not on file  . Highest education level: Not on file  Occupational History  . Not on file  Social Needs  . Financial resource strain: Not on file  . Food insecurity    Worry: Not on file    Inability: Not on file  . Transportation needs    Medical: Not on file    Non-medical: Not on file  Tobacco Use  . Smoking status: Never Smoker  . Smokeless tobacco: Never Used  Substance and Sexual Activity  . Alcohol use: No  . Drug use: No  . Sexual activity: Yes    Birth control/protection: Implant  Lifestyle  . Physical activity    Days per week: Not on file    Minutes per session: Not on file  . Stress: Not on file  Relationships  . Social Musicianconnections    Talks on phone: Not on file    Gets together: Not on file    Attends religious service: Not on file    Active member of club or organization: Not on file    Attends meetings of clubs or organizations: Not on file    Relationship status: Not on file  Other Topics Concern  . Not on file   Social History Narrative  . Not on file     Observations/Objective: Awake, alert and oriented x 3   Review of Systems  Constitutional: Negative for fever, malaise/fatigue and weight loss.  HENT: Negative.  Negative for nosebleeds.   Eyes: Negative.  Negative for blurred vision, double vision and photophobia.  Respiratory: Negative.  Negative for cough and shortness of breath.   Cardiovascular: Negative.  Negative for chest pain, palpitations and leg swelling.  Gastrointestinal: Negative.  Negative for heartburn, nausea and vomiting.  Musculoskeletal: Positive for back pain and joint pain. Negative for myalgias.  Neurological: Negative.  Negative for dizziness, focal weakness, seizures and headaches.  Psychiatric/Behavioral: Positive for depression. Negative for suicidal  ideas. The patient is nervous/anxious.     Assessment and Plan: Diagnoses and all orders for this visit:  Anxiety and depression -     hydrOXYzine (ATARAX/VISTARIL) 10 MG tablet; Take 1 tablet (10 mg total) by mouth 3 (three) times daily as needed for anxiety.  Right hip pain -     predniSONE (DELTASONE) 20 MG tablet; Take 1 tablet (20 mg total) by mouth daily with breakfast for 7 days. Work on losing weight to help reduce joint pain. May alternate with heat and ice application for pain relief. May also alternate with acetaminophen as prescribed pain relief. Other alternatives include massage, acupuncture and water aerobics.  You must stay active and avoid a sedentary lifestyle.   Follow Up Instructions Return in about 3 months (around 01/19/2019).     I discussed the assessment and treatment plan with the patient. The patient was provided an opportunity to ask questions and all were answered. The patient agreed with the plan and demonstrated an understanding of the instructions.   The patient was advised to call back or seek an in-person evaluation if the symptoms worsen or if the condition fails to improve as  anticipated.  I provided 19 minutes of non-face-to-face time during this encounter including median intraservice time, reviewing previous notes, labs, imaging, medications and explaining diagnosis and management.  Gildardo Pounds, FNP-BC

## 2018-10-20 ENCOUNTER — Telehealth: Payer: Self-pay | Admitting: Nurse Practitioner

## 2018-10-20 ENCOUNTER — Ambulatory Visit: Payer: Self-pay | Attending: Family Medicine

## 2018-10-20 ENCOUNTER — Other Ambulatory Visit: Payer: Self-pay

## 2018-10-20 ENCOUNTER — Other Ambulatory Visit: Payer: Self-pay | Admitting: Nurse Practitioner

## 2018-10-20 DIAGNOSIS — E559 Vitamin D deficiency, unspecified: Secondary | ICD-10-CM

## 2018-10-20 DIAGNOSIS — Z13220 Encounter for screening for lipoid disorders: Secondary | ICD-10-CM

## 2018-10-20 DIAGNOSIS — F419 Anxiety disorder, unspecified: Secondary | ICD-10-CM

## 2018-10-20 DIAGNOSIS — Z130 Encounter for screening for diseases of the blood and blood-forming organs and certain disorders involving the immune mechanism: Secondary | ICD-10-CM

## 2018-10-20 DIAGNOSIS — Z131 Encounter for screening for diabetes mellitus: Secondary | ICD-10-CM

## 2018-10-20 DIAGNOSIS — Z0000 Encounter for general adult medical examination without abnormal findings: Secondary | ICD-10-CM

## 2018-10-20 MED ORDER — ESCITALOPRAM OXALATE 20 MG PO TABS: 20.0000 mg | ORAL_TABLET | Freq: Every day | ORAL | 2 refills | Status: DC

## 2018-10-20 NOTE — Telephone Encounter (Signed)
Per Zelda, pt is restarting citalopram. Refills sent.

## 2018-10-20 NOTE — Telephone Encounter (Signed)
1) Medication(s) Requested (by name): Anxiety med Pain med  *patient doesn't know the name of the medication 2) Pharmacy of Choice:  [facility]

## 2018-10-21 LAB — CBC
Hematocrit: 39.7 % (ref 34.0–46.6)
Hemoglobin: 12.2 g/dL (ref 11.1–15.9)
MCH: 26.3 pg — ABNORMAL LOW (ref 26.6–33.0)
MCHC: 30.7 g/dL — ABNORMAL LOW (ref 31.5–35.7)
MCV: 86 fL (ref 79–97)
Platelets: 189 10*3/uL (ref 150–450)
RBC: 4.64 x10E6/uL (ref 3.77–5.28)
RDW: 13.4 % (ref 11.7–15.4)
WBC: 5.6 10*3/uL (ref 3.4–10.8)

## 2018-10-21 LAB — CMP14+EGFR
ALT: 14 IU/L (ref 0–32)
AST: 17 IU/L (ref 0–40)
Albumin/Globulin Ratio: 1.7 (ref 1.2–2.2)
Albumin: 4.4 g/dL (ref 3.8–4.8)
Alkaline Phosphatase: 83 IU/L (ref 39–117)
BUN/Creatinine Ratio: 10 (ref 9–23)
BUN: 7 mg/dL (ref 6–20)
Bilirubin Total: 0.3 mg/dL (ref 0.0–1.2)
CO2: 21 mmol/L (ref 20–29)
Calcium: 9.3 mg/dL (ref 8.7–10.2)
Chloride: 105 mmol/L (ref 96–106)
Creatinine, Ser: 0.67 mg/dL (ref 0.57–1.00)
GFR calc Af Amer: 128 mL/min/{1.73_m2} (ref >59)
GFR calc non Af Amer: 111 mL/min/{1.73_m2} (ref >59)
Globulin, Total: 2.6 g/dL (ref 1.5–4.5)
Glucose: 92 mg/dL (ref 65–99)
Potassium: 4.1 mmol/L (ref 3.5–5.2)
Sodium: 139 mmol/L (ref 134–144)
Total Protein: 7 g/dL (ref 6.0–8.5)

## 2018-10-21 LAB — LIPID PANEL
Chol/HDL Ratio: 3.8 ratio (ref 0.0–4.4)
Cholesterol, Total: 128 mg/dL (ref 100–199)
HDL: 34 mg/dL — ABNORMAL LOW (ref >39)
LDL Calculated: 70 mg/dL (ref 0–99)
Triglycerides: 122 mg/dL (ref 0–149)
VLDL Cholesterol Cal: 24 mg/dL (ref 5–40)

## 2018-10-21 LAB — VITAMIN D 25 HYDROXY (VIT D DEFICIENCY, FRACTURES): Vit D, 25-Hydroxy: 25 ng/mL — ABNORMAL LOW (ref 30.0–100.0)

## 2018-10-21 LAB — HEMOGLOBIN A1C
Est. average glucose Bld gHb Est-mCnc: 114 mg/dL
Hgb A1c MFr Bld: 5.6 % (ref 4.8–5.6)

## 2018-10-24 ENCOUNTER — Encounter: Payer: Self-pay | Admitting: Nurse Practitioner

## 2018-10-24 MED ORDER — PREDNISONE 20 MG PO TABS: 20.0000 mg | ORAL_TABLET | Freq: Every day | ORAL | 0 refills | Status: AC

## 2018-10-24 MED ORDER — HYDROXYZINE HCL 10 MG PO TABS: 10.0000 mg | ORAL_TABLET | Freq: Three times a day (TID) | ORAL | 1 refills | Status: DC | PRN

## 2018-10-29 ENCOUNTER — Ambulatory Visit: Payer: Self-pay | Attending: Family Medicine

## 2018-10-29 ENCOUNTER — Other Ambulatory Visit: Payer: Self-pay

## 2018-11-11 ENCOUNTER — Telehealth: Payer: Self-pay | Admitting: Nurse Practitioner

## 2018-11-11 NOTE — Telephone Encounter (Signed)
Pt was sent a letter from financial dept. Inform them, that the application they submitted was incomplete, since they were missing some documentation at the time of the appointment, Pt need to reschedule and resubmit all new papers and application for [employer] and [facility], P.S. old documents has been sent back to Pt and need to make a new appt °

## 2018-11-30 ENCOUNTER — Ambulatory Visit: Payer: Self-pay | Admitting: Nurse Practitioner

## 2019-05-14 ENCOUNTER — Ambulatory Visit: Payer: Self-pay | Attending: Internal Medicine

## 2019-05-14 DIAGNOSIS — Z23 Encounter for immunization: Secondary | ICD-10-CM

## 2019-05-14 NOTE — Progress Notes (Signed)
   Covid-19 Vaccination Clinic  Name:  Emery Binz    MRN: [id number] DOB: August 04, 1980  05/14/2019  [name] was observed post Covid-19 immunization for 15 minutes without incident. She was provided with Vaccine Information Sheet and instruction to access the V-Safe system.   [name] was instructed to call 911 with any severe reactions post vaccine: Marland Kitchen Difficulty breathing  . Swelling of face and throat  . A fast heartbeat  . A bad rash all over body  . Dizziness and weakness   Immunizations Administered    Name Date Dose VIS Date Route   Pfizer COVID-19 Vaccine 05/14/2019  8:48 AM 0.3 mL 02/04/2019 Intramuscular   Manufacturer: [employer], [employer]   Lot: [id number]   NDC: [address]-5

## 2019-06-04 ENCOUNTER — Ambulatory Visit: Payer: Self-pay | Attending: Internal Medicine

## 2019-06-04 DIAGNOSIS — Z23 Encounter for immunization: Secondary | ICD-10-CM

## 2019-06-04 NOTE — Progress Notes (Addendum)
   Covid-19 Vaccination Clinic  Name:  Julia Roy    MRN: [id number] DOB: [birth year]  06/04/2019  [name] was observed post Covid-19 immunization for 30 minutes based on pre-vaccination screening without incident. She was provided with Vaccine Information Sheet and instruction to access the V-Safe system.   Patient had minimal dizziness no other symptoms, vital signs checked by EMS within normal limits.Pateitn told to go to ER or call 911 if symptoms worsen. Patient alert and oriented. Patient has someone to drive her home.    [name] was instructed to call 911 with any severe reactions post vaccine: Marland Kitchen Difficulty breathing  . Swelling of face and throat  . A fast heartbeat  . A bad rash all over body  . Dizziness and weakness   Immunizations Administered    Name Date Dose VIS Date Route   Pfizer COVID-19 Vaccine 06/04/2019  8:43 AM 0.3 mL 02/04/2019 Intramuscular   Manufacturer: [employer], [employer]   Lot: [id number]   NDC: [address]-0

## 2020-06-13 ENCOUNTER — Ambulatory Visit: Payer: Self-pay | Admitting: Nurse Practitioner

## 2020-08-22 ENCOUNTER — Ambulatory Visit: Payer: Self-pay | Admitting: Nurse Practitioner

## 2020-10-06 ENCOUNTER — Other Ambulatory Visit: Payer: Self-pay

## 2020-10-06 ENCOUNTER — Emergency Department
Admission: EM | Admit: 2020-10-06 | Discharge: 2020-10-06 | Disposition: A | Discharge status: Home / Self Care | Payer: Worker's Compensation | Attending: Emergency Medicine | Admitting: Emergency Medicine

## 2020-10-06 DIAGNOSIS — T22212A Burn of second degree of left forearm, initial encounter: Secondary | ICD-10-CM

## 2020-10-06 DIAGNOSIS — Z23 Encounter for immunization: Secondary | ICD-10-CM | POA: Insufficient documentation

## 2020-10-06 DIAGNOSIS — Y990 Civilian activity done for income or pay: Secondary | ICD-10-CM | POA: Diagnosis not present

## 2020-10-06 DIAGNOSIS — S59912A Unspecified injury of left forearm, initial encounter: Secondary | ICD-10-CM | POA: Diagnosis present

## 2020-10-06 DIAGNOSIS — X102XXA Contact with fats and cooking oils, initial encounter: Secondary | ICD-10-CM | POA: Insufficient documentation

## 2020-10-06 MED ORDER — TETANUS-DIPHTH-ACELL PERTUSSIS 5-2.5-18.5 LF-MCG/0.5 IM SUSY
0.5000 mL | PREFILLED_SYRINGE | Freq: Once | INTRAMUSCULAR | Status: AC
  Administered 2020-10-06: 0.5 mL via INTRAMUSCULAR
  Filled 2020-10-06: qty 0.5

## 2020-10-06 MED ORDER — TRAMADOL HCL 50 MG PO TABS: 50.0000 mg | ORAL_TABLET | Freq: Four times a day (QID) | ORAL | 0 refills | Status: AC | PRN

## 2020-10-06 NOTE — ED Triage Notes (Addendum)
Pt comes pov with burn to left forearm on Tuesday. States pain is getting worse. Pt has neosporin ointment on her arm but states it's not helping. Burn had some open spots and some redness around the area but also has some scabbing as if healing.

## 2020-10-06 NOTE — ED Provider Notes (Signed)
[facility] Emergency Department Provider Note  ____________________________________________  Time seen: Approximately 8:00 PM  I have reviewed the triage vital signs and the nursing notes.   HISTORY  Chief Complaint Burn    HPI Julia Roy is a 40 y.o. female with no significant past medical history who comes ED complaining of a burn to the left forearm that happened 5 days ago from a splash of hot grease at work.  She had blistering over the area initially which has now ruptured.  She has been putting Neosporin ointment on the area, but complains of persistent pain.  No fever or chills or sweats.  No arm swelling, no chest pain or shortness of breath.  Pain is constant, worse with hanging the arm down.    History reviewed. No pertinent past medical history.   There are no problems to display for this patient.    History reviewed. No pertinent surgical history.   Prior to Admission medications   Not on File     Allergies Patient has no known allergies.   History reviewed. No pertinent family history.  Social History    Review of Systems  Constitutional:   No fever or chills.  ENT:   No sore throat. No rhinorrhea. Cardiovascular:   No chest pain or syncope. Respiratory:   No dyspnea or cough. Gastrointestinal:   Negative for abdominal pain, vomiting and diarrhea.  Musculoskeletal:   Left arm pain as above All other systems reviewed and are negative except as documented above in ROS and HPI.  ____________________________________________   PHYSICAL EXAM:  VITAL SIGNS: ED Triage Vitals  Enc Vitals Group     BP 10/06/20 1811 122/86     Pulse Rate 10/06/20 1811 79     Resp 10/06/20 1811 18     Temp 10/06/20 1811 97.9 F (36.6 C)     Temp Source 10/06/20 1811 Oral     SpO2 10/06/20 1811 98 %     Weight 10/06/20 1809 160 lb (72.6 kg)     Height 10/06/20 1809 5' (1.524 m)     Head Circumference --      Peak Flow --       Pain Score 10/06/20 1808 9     Pain Loc --      Pain Edu? --      Excl. in GC? --     Vital signs reviewed, nursing assessments reviewed.   Constitutional:   Alert and oriented. Non-toxic appearance. Eyes:   Conjunctivae are normal. EOMI. ENT      Head:   Normocephalic and atraumatic.           Neck:   No meningismus. Full ROM. Cardiovascular:   RRR. Symmetric bilateral radial and DP pulses.  Cap refill less than 2 seconds. Respiratory:   Normal respiratory effort without tachypnea/retractions.   Musculoskeletal:   Normal range of motion in all extremities.  No edema.  Compartments soft Neurologic:   Normal speech and language.  Motor grossly intact. No acute focal neurologic deficits are appreciated.  Skin:    Skin is warm, dry with brown circumferential burn on the left forearm.  No loss of sensation.  Blanches and refills quickly..  No inflammatory changes or streaking. ____________________________________________    LABS (pertinent positives/negatives) (all labs ordered are listed, but only abnormal results are displayed) Labs Reviewed - No data to display ____________________________________________   EKG  ____________________________________________    RADIOLOGY  No results found.  ____________________________________________  PROCEDURES Procedures  ____________________________________________  CLINICAL IMPRESSION / ASSESSMENT AND PLAN / ED COURSE  Pertinent labs & imaging results that were available during my care of the patient were reviewed by me and considered in my medical decision making (see chart for details).  Julia Roy was evaluated in Emergency Department on 10/06/2020 for the symptoms described in the history of present illness. She was evaluated in the context of the global COVID-19 pandemic, which necessitated consideration that the patient might be at risk for infection with the SARS-CoV-2 virus that causes COVID-19.  Institutional protocols and algorithms that pertain to the evaluation of patients at risk for COVID-19 are in a state of rapid change based on information released by regulatory bodies including the CDC and federal and state organizations. These policies and algorithms were followed during the patient's care in the ED.   Patient presents with burn to left forearm.  No evidence of infection or full-thickness burn or compartment syndrome.  Vital signs are normal, will update tetanus, discharge.  Given instructions on wound care.      ____________________________________________   FINAL CLINICAL IMPRESSION(S) / ED DIAGNOSES    Final diagnoses:  Partial thickness burn of left forearm, initial encounter     ED Discharge Orders     None       Portions of this note were generated with dragon dictation software. Dictation errors may occur despite best attempts at proofreading.   Sharman Cheek, MD 10/06/20 2002

## 2020-10-08 ENCOUNTER — Encounter: Payer: Self-pay | Admitting: Nurse Practitioner

## 2020-10-12 ENCOUNTER — Other Ambulatory Visit (HOSPITAL_COMMUNITY)
Admission: RE | Admit: 2020-10-12 | Discharge: 2020-10-12 | Disposition: A | Discharge status: Home / Self Care | Payer: Self-pay | Source: Ambulatory Visit | Attending: Nurse Practitioner | Admitting: Nurse Practitioner

## 2020-10-12 ENCOUNTER — Encounter: Payer: Self-pay | Admitting: Nurse Practitioner

## 2020-10-12 ENCOUNTER — Ambulatory Visit: Payer: Self-pay | Attending: Nurse Practitioner | Admitting: Nurse Practitioner

## 2020-10-12 ENCOUNTER — Other Ambulatory Visit: Payer: Self-pay

## 2020-10-12 VITALS — BP 108/71 | HR 55 | Wt 168.8 lb

## 2020-10-12 DIAGNOSIS — E785 Hyperlipidemia, unspecified: Secondary | ICD-10-CM

## 2020-10-12 DIAGNOSIS — Z131 Encounter for screening for diabetes mellitus: Secondary | ICD-10-CM

## 2020-10-12 DIAGNOSIS — Z114 Encounter for screening for human immunodeficiency virus [HIV]: Secondary | ICD-10-CM

## 2020-10-12 DIAGNOSIS — Z124 Encounter for screening for malignant neoplasm of cervix: Secondary | ICD-10-CM

## 2020-10-12 DIAGNOSIS — D649 Anemia, unspecified: Secondary | ICD-10-CM

## 2020-10-12 DIAGNOSIS — Z1231 Encounter for screening mammogram for malignant neoplasm of breast: Secondary | ICD-10-CM

## 2020-10-12 DIAGNOSIS — R1030 Lower abdominal pain, unspecified: Secondary | ICD-10-CM

## 2020-10-12 DIAGNOSIS — F32A Depression, unspecified: Secondary | ICD-10-CM | POA: Insufficient documentation

## 2020-10-12 NOTE — Progress Notes (Signed)
Assessment & Plan:  Ronin was seen today for gynecologic exam.  Diagnoses and all orders for this visit:  Encounter for Papanicolaou smear for cervical cancer screening -     Cytology - PAP -     Cervicovaginal ancillary only  Breast cancer screening by mammogram -     MM 3D SCREEN BREAST BILATERAL; Future  Encounter for screening for diabetes mellitus -     Hemoglobin A1c -     CMP14+EGFR  Dyslipidemia, goal LDL below 100 -     Lipid panel  Anemia, unspecified type -     CBC with Differential  Lower abdominal pain -     Urinalysis, Complete  Encounter for screening for HIV -     HIV antibody (with reflex)   Patient has been counseled on age-appropriate routine health concerns for screening and prevention. These are reviewed and up-to-date. Referrals have been placed accordingly. Immunizations are up-to-date or declined.    Subjective:   Chief Complaint  Patient presents with   Gynecologic Exam   HPI Julia Roy 40 y.o. female presents to office today for Pap smear.  She also has complaints of lower abdominal/pelvic pain and dyspareunia.  She does have a history of right ovarian cyst and abnormal Pap with colposcopy.    VRI was used to communicate directly with patient for the entire encounter including providing detailed patient instructions.    Abdominal Pain The pain is described as aching, pressure-like, and sharp, and is 6/10 in intensity. Pain is located in the lower abdomen and pelvic region.  Onset was several months ago. Symptoms have been stable since. Aggravating factors: none.  Alleviating factors: Pain resolves on its own. Associated symptoms: none. The patient denies constipation, diarrhea, fever, hematochezia, hematuria, melena, nausea, and vomiting.     Review of Systems  Constitutional:  Negative for fever, malaise/fatigue and weight loss.  HENT: Negative.  Negative for nosebleeds.   Eyes: Negative.  Negative for blurred vision,  double vision and photophobia.  Respiratory: Negative.  Negative for cough and shortness of breath.   Cardiovascular: Negative.  Negative for chest pain, palpitations and leg swelling.  Gastrointestinal:  Positive for abdominal pain. Negative for blood in stool, diarrhea, heartburn, melena, nausea and vomiting.  Musculoskeletal: Negative.  Negative for myalgias.  Neurological: Negative.  Negative for dizziness, focal weakness, seizures and headaches.  Psychiatric/Behavioral: Negative.  Negative for suicidal ideas.    Past Medical History:  Diagnosis Date   Anxiety    Back pain    Hyperlipidemia    Thrombocytopenia ([place])     No past surgical history on file.  Family History  Problem Relation Age of Onset   Stroke Mother    Vision loss Mother    Hypertension Mother    Cancer Mother    Diabetes Mother    Hyperlipidemia Mother     Social History Reviewed with no changes to be made today.   Outpatient Medications Prior to Visit  Medication Sig Dispense Refill   albuterol (PROVENTIL HFA;VENTOLIN HFA) 108 (90 Base) MCG/ACT inhaler Inhale 2 puffs into the lungs every 6 (six) hours as needed for wheezing or shortness of breath. 1 Inhaler 2   escitalopram (LEXAPRO) 20 MG tablet Take 1 tablet (20 mg total) by mouth daily. 30 tablet 2   hydrOXYzine (ATARAX/VISTARIL) 10 MG tablet Take 1 tablet (10 mg total) by mouth 3 (three) times daily as needed for anxiety. 60 tablet 1   Vitamin D, Ergocalciferol, (DRISDOL) 50000 units  CAPS capsule Take 1 capsule (50,000 Units total) by mouth every 7 (seven) days. 12 capsule 1   No facility-administered medications prior to visit.    No Known Allergies     Objective:    BP 108/71   Pulse (!) 55   Wt 168 lb 12.8 oz (76.6 kg)   LMP 09/17/2020 (Approximate)   SpO2 100%   BMI 32.97 kg/m  Wt Readings from Last 3 Encounters:  10/12/20 168 lb 12.8 oz (76.6 kg)  10/06/20 160 lb (72.6 kg)  12/01/17 167 lb 1.6 oz (75.8 kg)    Physical Exam Exam  conducted with a chaperone present.  Constitutional:      Appearance: She is well-developed.  HENT:     Head: Normocephalic.  Cardiovascular:     Rate and Rhythm: Normal rate and regular rhythm.     Heart sounds: Normal heart sounds.  Pulmonary:     Effort: Pulmonary effort is normal.     Breath sounds: Normal breath sounds.  Abdominal:     General: Bowel sounds are normal.     Palpations: Abdomen is soft.     Tenderness: There is abdominal tenderness in the suprapubic area and left lower quadrant.     Hernia: There is no hernia in the left inguinal area.  Genitourinary:    Exam position: Lithotomy position.     Labia:        Right: No rash, tenderness, lesion or injury.        Left: No rash, tenderness, lesion or injury.      Vagina: Normal. No signs of injury and foreign body. No vaginal discharge, erythema, tenderness or bleeding.     Cervix: Lesion present. No cervical motion tenderness or friability.     Uterus: Not deviated and not enlarged.      Adnexa:        Right: No mass, tenderness or fullness.         Left: No mass, tenderness or fullness.       Rectum: Normal. No external hemorrhoid.     Lymphadenopathy:     Lower Body: No right inguinal adenopathy. No left inguinal adenopathy.  Skin:    General: Skin is warm and dry.  Neurological:     Mental Status: She is alert and oriented to person, place, and time.  Psychiatric:        Behavior: Behavior normal.        Thought Content: Thought content normal.        Judgment: Judgment normal.         Patient has been counseled extensively about nutrition and exercise as well as the importance of adherence with medications and regular follow-up. The patient was given clear instructions to go to ER or return to medical center if symptoms don't improve, worsen or new problems develop. The patient verbalized understanding.   Follow-up: Return if symptoms worsen or fail to improve.   Gildardo Pounds, FNP-BC [facility] and [place], [place]   10/12/2020, 12:50 PM

## 2020-10-13 LAB — CBC WITH DIFFERENTIAL/PLATELET
Basophils Absolute: 0.1 10*3/uL (ref 0.0–0.2)
Basos: 1 %
EOS (ABSOLUTE): 0.2 10*3/uL (ref 0.0–0.4)
Eos: 4 %
Hematocrit: 40.3 % (ref 34.0–46.6)
Hemoglobin: 12.7 g/dL (ref 11.1–15.9)
Immature Grans (Abs): 0 10*3/uL (ref 0.0–0.1)
Immature Granulocytes: 0 %
Lymphocytes Absolute: 2.5 10*3/uL (ref 0.7–3.1)
Lymphs: 41 %
MCH: 27.1 pg (ref 26.6–33.0)
MCHC: 31.5 g/dL (ref 31.5–35.7)
MCV: 86 fL (ref 79–97)
Monocytes Absolute: 0.3 10*3/uL (ref 0.1–0.9)
Monocytes: 5 %
Neutrophils Absolute: 3 10*3/uL (ref 1.4–7.0)
Neutrophils: 49 %
Platelets: 159 10*3/uL (ref 150–450)
RBC: 4.69 x10E6/uL (ref 3.77–5.28)
RDW: 12.8 % (ref 11.7–15.4)
WBC: 6.1 10*3/uL (ref 3.4–10.8)

## 2020-10-13 LAB — LIPID PANEL
Chol/HDL Ratio: 4.3 ratio (ref 0.0–4.4)
Cholesterol, Total: 167 mg/dL (ref 100–199)
HDL: 39 mg/dL — ABNORMAL LOW (ref >39)
LDL Chol Calc (NIH): 109 mg/dL — ABNORMAL HIGH (ref 0–99)
Triglycerides: 106 mg/dL (ref 0–149)
VLDL Cholesterol Cal: 19 mg/dL (ref 5–40)

## 2020-10-13 LAB — CMP14+EGFR
ALT: 25 IU/L (ref 0–32)
AST: 22 IU/L (ref 0–40)
Albumin/Globulin Ratio: 1.5 (ref 1.2–2.2)
Albumin: 4.2 g/dL (ref 3.8–4.8)
Alkaline Phosphatase: 83 IU/L (ref 44–121)
BUN/Creatinine Ratio: 14 (ref 9–23)
BUN: 10 mg/dL (ref 6–24)
Bilirubin Total: <0.2 mg/dL (ref 0.0–1.2)
CO2: 17 mmol/L — ABNORMAL LOW (ref 20–29)
Calcium: 9.1 mg/dL (ref 8.7–10.2)
Chloride: 103 mmol/L (ref 96–106)
Creatinine, Ser: 0.72 mg/dL (ref 0.57–1.00)
Globulin, Total: 2.8 g/dL (ref 1.5–4.5)
Glucose: 91 mg/dL (ref 65–99)
Potassium: 4 mmol/L (ref 3.5–5.2)
Sodium: 138 mmol/L (ref 134–144)
Total Protein: 7 g/dL (ref 6.0–8.5)
eGFR: 108 mL/min/{1.73_m2} (ref >59)

## 2020-10-13 LAB — URINALYSIS, COMPLETE
Bilirubin, UA: NEGATIVE
Glucose, UA: NEGATIVE
Ketones, UA: NEGATIVE
Leukocytes,UA: NEGATIVE
Nitrite, UA: NEGATIVE
Protein,UA: NEGATIVE
Specific Gravity, UA: 1.013 (ref 1.005–1.030)
Urobilinogen, Ur: 0.2 mg/dL (ref 0.2–1.0)
pH, UA: 5.5 (ref 5.0–7.5)

## 2020-10-13 LAB — MICROSCOPIC EXAMINATION
Bacteria, UA: NONE SEEN
Casts: NONE SEEN /lpf
Epithelial Cells (non renal): NONE SEEN /hpf (ref 0–10)
RBC: NONE SEEN /hpf (ref 0–2)
WBC, UA: NONE SEEN /hpf (ref 0–5)

## 2020-10-13 LAB — HEMOGLOBIN A1C
Est. average glucose Bld gHb Est-mCnc: 114 mg/dL
Hgb A1c MFr Bld: 5.6 % (ref 4.8–5.6)

## 2020-10-13 LAB — HIV ANTIBODY (ROUTINE TESTING W REFLEX): HIV Screen 4th Generation wRfx: NONREACTIVE

## 2020-10-15 LAB — CERVICOVAGINAL ANCILLARY ONLY
Bacterial Vaginitis (gardnerella): NEGATIVE
Candida Glabrata: NEGATIVE
Candida Vaginitis: NEGATIVE
Chlamydia: NEGATIVE
Comment: NEGATIVE
Comment: NEGATIVE
Comment: NEGATIVE
Comment: NEGATIVE
Comment: NEGATIVE
Comment: NORMAL
Neisseria Gonorrhea: NEGATIVE
Trichomonas: NEGATIVE

## 2020-10-16 LAB — CYTOLOGY - PAP
Comment: NEGATIVE
Diagnosis: NEGATIVE
High risk HPV: NEGATIVE

## 2020-10-19 ENCOUNTER — Ambulatory Visit: Payer: Self-pay

## 2020-10-19 ENCOUNTER — Telehealth: Payer: Self-pay | Admitting: Nurse Practitioner

## 2020-10-19 NOTE — Telephone Encounter (Signed)
error 

## 2020-11-20 ENCOUNTER — Telehealth: Payer: Self-pay | Admitting: Nurse Practitioner

## 2020-11-20 NOTE — Telephone Encounter (Signed)
Copied from CRM [id number]. Topic: Appointment Scheduling - Scheduling Inquiry for Clinic >> Nov 15, 2020 10:17 AM Marylen Ponto wrote: Reason for CRM: Pt would like to schedule an appt for financial counseling. Cb# [phone]

## 2020-11-20 NOTE — Telephone Encounter (Signed)
I return Pt call, she was schedule for financial appt 11/27/20

## 2020-11-27 ENCOUNTER — Other Ambulatory Visit: Payer: Self-pay

## 2020-11-27 ENCOUNTER — Ambulatory Visit: Payer: Self-pay | Attending: Nurse Practitioner

## 2020-12-06 ENCOUNTER — Telehealth: Payer: Self-pay

## 2020-12-06 DIAGNOSIS — N6310 Unspecified lump in the right breast, unspecified quadrant: Secondary | ICD-10-CM

## 2020-12-20 ENCOUNTER — Ambulatory Visit: Payer: Self-pay

## 2020-12-20 ENCOUNTER — Ambulatory Visit
Admission: RE | Admit: 2020-12-20 | Discharge: 2020-12-20 | Disposition: A | Discharge status: Home / Self Care | Payer: No Typology Code available for payment source | Source: Ambulatory Visit | Attending: Obstetrics and Gynecology | Admitting: Obstetrics and Gynecology

## 2020-12-20 ENCOUNTER — Ambulatory Visit: Payer: Self-pay | Admitting: *Deleted

## 2020-12-20 ENCOUNTER — Other Ambulatory Visit: Payer: Self-pay

## 2020-12-20 VITALS — BP 116/74 | Wt 169.6 lb

## 2020-12-20 DIAGNOSIS — N644 Mastodynia: Secondary | ICD-10-CM

## 2020-12-20 DIAGNOSIS — Z1239 Encounter for other screening for malignant neoplasm of breast: Secondary | ICD-10-CM

## 2020-12-20 DIAGNOSIS — N6310 Unspecified lump in the right breast, unspecified quadrant: Secondary | ICD-10-CM

## 2020-12-20 NOTE — Patient Instructions (Signed)
Explained breast self awareness with Madelaine Bhat. Patient did not need a Pap smear today due to last Pap smear and HPV typing was 10/12/2020. Let her know that her next Pap smear is due in 3 years due to her history of an abnormal Pap smear. Referred patient to the [facility] for a diagnostic mammogram. Appointment scheduled Thursday, December 20, 2020 at 1310. Patient aware of appointment and will be there. Madelaine Bhat verbalized understanding.  Mcguire Gasparyan, Kathaleen Maser, RN 11:41 AM

## 2020-12-20 NOTE — Progress Notes (Signed)
Julia Roy is a 40 y.o. female who presents to [facility] clinic today with complaint of right upper inner breast lump x one year. Patient stated that it is painful at times. Patient rates the pain at a 4 out of 10. Patient stated the the lump was like a cyst and drained a white drainage that resolved 7 months ago.    Pap Smear: Pap smear not completed today. Last Pap smear was 10/12/2020 at [facility] and was normal with negative HPV. Per patient has history of an abnormal Pap smear around 15 years ago that a colposcopy and LEEP were completed for follow-up. Patient stated all Pap smears have been normal since LEEP and that she has had at least three normal Pap smears. Last Pap smear result is available in Epic.   Physical exam: Breasts Breasts symmetrical. No skin abnormalities bilateral breasts. No nipple retraction bilateral breasts. No nipple discharge bilateral breasts. No lymphadenopathy. No lumps palpated bilateral breasts. Unable to palpate a lump in patients area of concern. Complaints of right breast pain between 1 o'clock and 2 o'clock 9 cm from the nipple on exam.  MS DIGITAL DIAG TOMO BILAT  Result Date: 12/20/2020 CLINICAL DATA:  40 year old female presenting for evaluation of bilateral cramping breast pains for over 1 year. EXAM: DIGITAL DIAGNOSTIC BILATERAL MAMMOGRAM WITH TOMOSYNTHESIS AND CAD TECHNIQUE: Bilateral digital diagnostic mammography and breast tomosynthesis was performed. The images were evaluated with computer-aided detection. COMPARISON:  None. ACR Breast Density Category b: There are scattered areas of fibroglandular density. FINDINGS: No suspicious calcifications, masses or areas of distortion are seen in the bilateral breasts. IMPRESSION: 1. No findings in either breast to explain the patient's nonfocal breast pain. 2.  No mammographic evidence of malignancy in the bilateral breasts. RECOMMENDATION: 1. Breast pain is a  common condition, which will often resolve on its own without intervention. It can be affected by hormonal changes, medication side effect, weight changes and fit of the bra. Pain may also be referred from other adjacent areas of the body. Breast pain may be improved by wearing adequate well-fitting support, over-the-counter topical and oral NSAID medication, low-fat diet, and ice/heat as needed. Studies have shown an improvement in cyclic pain with use of evening primrose oil and vitamin E. Clinical follow-up recommended to discuss any further work-up recommendations and appropriate treatment. 2.  Screening mammogram in one year.(Code:SM-B-01Y) I have discussed the findings and recommendations with the patient. If applicable, a reminder letter will be sent to the patient regarding the next appointment. BI-RADS CATEGORY  1: Negative. Electronically Signed   By: Frederico Hamman M.D.   On: 12/20/2020 13:32       Pelvic/Bimanual Pap is not indicated today per BCCCP guidelines.   Smoking History: Patient has never smoked.   Patient Navigation: Patient education provided. Access to services provided for patient through [place] program. Spanish interpreter Alene Mires from [facility] provided.    Breast and Cervical Cancer Risk Assessment: Patient does not have family history of breast cancer, known genetic mutations, or radiation treatment to the chest before age 58. Per patient has history of cervical dysplasia. Patient has no history of being immunocompromised or DES exposure in-utero.  Risk Assessment     Risk Scores       12/20/2020   Last edited by: Narda Rutherford, LPN   5-year risk: 0.3 %   Lifetime risk: 4.7 %           A: BCCCP  exam without pap smear Complaint of right inner breast lump and pain.  P: Referred patient to the [facility] for a diagnostic mammogram. Appointment scheduled Thursday, December 20, 2020 at 1310.  Priscille Heidelberg, RN 12/20/2020 11:41 AM

## 2020-12-31 ENCOUNTER — Encounter (HOSPITAL_COMMUNITY): Payer: Self-pay | Admitting: Emergency Medicine

## 2020-12-31 ENCOUNTER — Other Ambulatory Visit: Payer: Self-pay

## 2020-12-31 ENCOUNTER — Inpatient Hospital Stay (HOSPITAL_COMMUNITY)
Admission: AD | Admit: 2020-12-31 | Discharge: 2020-12-31 | Disposition: A | Discharge status: Home / Self Care | Payer: No Typology Code available for payment source | Attending: Obstetrics and Gynecology | Admitting: Obstetrics and Gynecology

## 2020-12-31 ENCOUNTER — Ambulatory Visit (HOSPITAL_COMMUNITY): Admission: EM | Admit: 2020-12-31 | Discharge: 2020-12-31 | Payer: No Typology Code available for payment source

## 2020-12-31 ENCOUNTER — Encounter (HOSPITAL_COMMUNITY): Payer: Self-pay | Admitting: Obstetrics and Gynecology

## 2020-12-31 ENCOUNTER — Inpatient Hospital Stay (HOSPITAL_COMMUNITY): Payer: No Typology Code available for payment source

## 2020-12-31 DIAGNOSIS — Z3A01 Less than 8 weeks gestation of pregnancy: Secondary | ICD-10-CM | POA: Insufficient documentation

## 2020-12-31 DIAGNOSIS — N939 Abnormal uterine and vaginal bleeding, unspecified: Secondary | ICD-10-CM

## 2020-12-31 DIAGNOSIS — O3680X0 Pregnancy with inconclusive fetal viability, not applicable or unspecified: Secondary | ICD-10-CM

## 2020-12-31 DIAGNOSIS — O209 Hemorrhage in early pregnancy, unspecified: Secondary | ICD-10-CM | POA: Insufficient documentation

## 2020-12-31 LAB — CBC
HCT: 36.4 % (ref 36.0–46.0)
Hemoglobin: 11.5 g/dL — ABNORMAL LOW (ref 12.0–15.0)
MCH: 26.7 pg (ref 26.0–34.0)
MCHC: 31.6 g/dL (ref 30.0–36.0)
MCV: 84.5 fL (ref 80.0–100.0)
Platelets: 174 10*3/uL (ref 150–400)
RBC: 4.31 MIL/uL (ref 3.87–5.11)
RDW: 12.9 % (ref 11.5–15.5)
WBC: 7.7 10*3/uL (ref 4.0–10.5)
nRBC: 0 % (ref 0.0–0.2)

## 2020-12-31 LAB — ABO/RH: ABO/RH(D): O POS

## 2020-12-31 LAB — HCG, QUANTITATIVE, PREGNANCY: hCG, Beta Chain, Quant, S: 489 m[IU]/mL — ABNORMAL HIGH (ref <5)

## 2020-12-31 LAB — POCT PREGNANCY, URINE: Preg Test, Ur: POSITIVE — AB

## 2020-12-31 NOTE — ED Notes (Signed)
Patient is being discharged from the Urgent Care and sent to the Emergency Department via personal vehicle . Per Provider Salli Quarry, patient is in need of higher level of care due to abnormal vaginal bleeding. Patient is aware and verbalizes understanding of plan of care.  Vitals:   12/31/20 2010  BP: 109/73  Pulse: 65  Resp: 20  Temp: 98.3 F (36.8 C)  SpO2: 98%

## 2020-12-31 NOTE — Discharge Instructions (Addendum)
[address] [place], [place], [place] [address]- Maternity admissions unit

## 2020-12-31 NOTE — MAU Note (Signed)
..  Julia Roy is a 40 y.o. at Unknown here in MAU reporting: recent at home miscarriage on Oct. 29th. Today she is here because she is still having some brown spotting. Denies abdominal pain.  LMP: End of August  Pain score: 0/10 Vitals:   12/31/20 2124  BP: 126/71  Pulse: 66  Resp: 17  Temp: 97.9 F (36.6 C)  SpO2: 99%

## 2020-12-31 NOTE — ED Triage Notes (Signed)
Patient has vaginal bleeding, clots, dark brown clots.  Patient has had vaginal bleeding , spotting for one week.  Patients provider instructed her to come to be seen.  Patient had a negative home pregnancy test.  Patient describes in great detail what she perceives to have been a miscarriage and the description of the products of conception that she passed.  Last period was in July.  States she did 2 pregnancy tests and both were negative.  Patient is concerned retained tissue/parts may be causing issues now

## 2020-12-31 NOTE — Discharge Instructions (Signed)
Return to care  If you have heavier bleeding that soaks through more than 2 pads per hour for an hour or more If you bleed so much that you feel like you might pass out or you do pass out If you have significant abdominal pain that is not improved with Tylenol   

## 2020-12-31 NOTE — MAU Provider Note (Signed)
History     CSN: [id number]  Arrival date and time: 12/31/20 2057   Event Date/Time   First Provider Initiated Contact with Patient 12/31/20 2152      Chief Complaint  Patient presents with   Vaginal Bleeding   HPI Julia Roy is a 40 y.o. [id number] at Unknown who presents with vaginal bleeding.  States on October 25 she believes she had a miscarriage.  Thinks she should have been at least 2 months pregnant at the time.  States she passed tissue that looked like a tiny baby.  Since then she has continued to have some bleeding which is decreased now to brown/dark red spotting.  Has had some intermittent left lower quadrant pain for the last week that occurs when she is having a bowel movement.  No fever.  OB History     Gravida  6   Para  5   Term  5   Preterm      AB      Living  4      SAB      IAB      Ectopic      Multiple      Live Births  5           Past Medical History:  Diagnosis Date   Anxiety    Back pain    Hyperlipidemia    Thrombocytopenia ([facility])     History reviewed. No pertinent surgical history.  Family History  Problem Relation Age of Onset   Cancer Mother    Stroke Mother    Vision loss Mother    Hypertension Mother    Diabetes Mother    Hyperlipidemia Mother     Social History   Tobacco Use   Smoking status: Never   Smokeless tobacco: Never  Vaping Use   Vaping Use: Never used  Substance Use Topics   Alcohol use: No   Drug use: No    Allergies: No Known Allergies  No medications prior to admission.    Review of Systems  Constitutional: Negative.   Gastrointestinal:  Positive for abdominal pain. Negative for constipation, diarrhea, nausea and vomiting.  Genitourinary:  Positive for vaginal bleeding. Negative for dysuria and vaginal discharge.  Physical Exam   Blood pressure 115/63, pulse 63, temperature 97.9 F (36.6 C), resp. rate 17, height 5\' 1"  (1.549 m), weight 77.8 kg, SpO2 99 %.  Physical  Exam Vitals and nursing note reviewed.  Constitutional:      General: She is not in acute distress.    Appearance: Normal appearance. She is not ill-appearing or toxic-appearing.  HENT:     Head: Normocephalic and atraumatic.  Eyes:     General: No scleral icterus. Pulmonary:     Effort: Pulmonary effort is normal. No respiratory distress.  Abdominal:     General: Abdomen is flat. There is no distension.     Palpations: Abdomen is soft.     Tenderness: There is no abdominal tenderness. There is no rebound.  Skin:    General: Skin is warm and dry.  Neurological:     General: No focal deficit present.     Mental Status: She is alert.  Psychiatric:        Mood and Affect: Mood normal.        Behavior: Behavior normal.    MAU Course  Procedures Results for orders placed or performed during the hospital encounter of 12/31/20 (from the past 24 hour(s))  Pregnancy, urine  POC     Status: Abnormal   Collection Time: 12/31/20  9:12 PM  Result Value Ref Range   Preg Test, Ur POSITIVE (A) NEGATIVE  CBC     Status: Abnormal   Collection Time: 12/31/20 10:01 PM  Result Value Ref Range   WBC 7.7 4.0 - 10.5 K/uL   RBC 4.31 3.87 - 5.11 MIL/uL   Hemoglobin 11.5 (L) 12.0 - 15.0 g/dL   HCT 50.3 54.6 - 56.8 %   MCV 84.5 80.0 - 100.0 fL   MCH 26.7 26.0 - 34.0 pg   MCHC 31.6 30.0 - 36.0 g/dL   RDW 12.7 51.7 - 00.1 %   Platelets 174 150 - 400 K/uL   nRBC 0.0 0.0 - 0.2 %  ABO/Rh     Status: None   Collection Time: 12/31/20 10:01 PM  Result Value Ref Range   ABO/RH(D) O POS    No rh immune globuloin      NOT A RH IMMUNE GLOBULIN CANDIDATE, PT RH POSITIVE Performed at [facility], [address] [place], [place] [address]   hCG, quantitative, pregnancy     Status: Abnormal   Collection Time: 12/31/20 10:01 PM  Result Value Ref Range   hCG, Beta Chain, Quant, S 489 (H) <5 mIU/mL   US OB LESS THAN 14 WEEKS WITH OB TRANSVAGINAL  Result Date: 12/31/2020 CLINICAL DATA:  Bleeding,  history of spontaneous abortion at end of October. EXAM: OBSTETRIC <14 WK [place] AND TRANSVAGINAL OB US TECHNIQUE: Both transabdominal and transvaginal ultrasound examinations were performed for complete evaluation of the gestation as well as the maternal uterus, adnexal regions, and pelvic cul-de-sac. Transvaginal technique was performed to assess early pregnancy. COMPARISON:  08/23/2013. FINDINGS: Intrauterine gestational sac: None. Yolk sac:  None. Embryo:  None. Cardiac Activity: None. Heart Rate: None. Subchorionic hemorrhage:  None visualized. Maternal uterus/adnexae: The right ovary measures 3.0 x 3.7 x 1.7 cm. The left ovary is not visualized on exam. No intrauterine gestational sac is identified. The endometrium is not well delineated and appears heterogeneous at the fundus. IMPRESSION: 1. No evidence of intrauterine pregnancy. Correlation with beta HCG is recommended. 2. Heterogeneous endometrium, not well delineated on exam Electronically Signed   By: Thornell Sartorius M.D.   On: 12/31/2020 22:38    MDM Patient presents with vaginal spotting after possible miscarriage a few weeks ago. Was never seen during this pregnancy.  She is RH positive.   Ultrasound shows no IUP. HCG is 489.  Images reviewed by Dr. Donavan Foil who recommends repeat HCG  *[facility] provided Spanish interpreter used for this encounter* Assessment and Plan   1. Pregnancy of unknown anatomic location   2. Vaginal bleeding in pregnancy, first trimester    -scheduled for stat HCG Thursday morning -reviewed reasons to return to MAU  Julia Roy 01/01/2021, 1:10 AM

## 2021-01-01 NOTE — ED Provider Notes (Signed)
[facility]    CSN: [id number] Arrival date & time: 12/31/20  1802      History   Chief Complaint Chief Complaint  Patient presents with   Vaginal Bleeding    HPI Dimples Probus is a 40 y.o. female.   Patient planing resents with vaginal bleeding, color described as dark brown and blood clots for 3 days.  Endorses bleeding was heavy on the first day began to swell on the second day and has become heavier today.  Endorses lower abdominal pressure as well.  Patient believes this to be a miscarriage and describes in great detail what she believes to be fetal tissue and a sac that she saw come out while she was bleeding at home 2 days ago.  Last menstrual period was in July.  Endorses home pregnancy test negative.   Past Medical History:  Diagnosis Date   Anxiety    Back pain    Hyperlipidemia    Thrombocytopenia [facility])     Patient Active Problem List   Diagnosis Date Noted   Anxiety and depression 10/12/2020   Abdominal pain, unspecified site 08/15/2013   Rib fracture 08/15/2013   Vaginal discharge 08/15/2013    History reviewed. No pertinent surgical history.  OB History     Gravida  6   Para  5   Term  5   Preterm      AB      Living  4      SAB      IAB      Ectopic      Multiple      Live Births  5            Home Medications    Prior to Admission medications   Medication Sig Start Date End Date Taking? Authorizing Provider  Multiple Vitamin (MULTIVITAMIN) tablet Take 1 tablet by mouth daily. Patient not taking: Reported on 12/31/2020    [provider]    Family History Family History  Problem Relation Age of Onset   Cancer Mother    Stroke Mother    Vision loss Mother    Hypertension Mother    Diabetes Mother    Hyperlipidemia Mother     Social History Social History   Tobacco Use   Smoking status: Never   Smokeless tobacco: Never  Vaping Use   Vaping Use: Never used  Substance Use Topics    Alcohol use: No   Drug use: No     Allergies   Patient has no known allergies.   Review of Systems Review of Systems  Genitourinary:  Positive for vaginal bleeding.    Physical Exam Triage Vital Signs ED Triage Vitals  Enc Vitals Group     BP 12/31/20 2010 109/73     Pulse Rate 12/31/20 2010 65     Resp 12/31/20 2010 20     Temp 12/31/20 2010 98.3 F (36.8 C)     Temp Source 12/31/20 2010 Oral     SpO2 12/31/20 2010 98 %     Weight --      Height --      Head Circumference --      Peak Flow --      Pain Score 12/31/20 2005 1     Pain Loc --      Pain Edu? --      Excl. in GC? --    No data found.  Updated Vital Signs BP 109/73 (BP Location:  Right Arm)   Pulse 65   Temp 98.3 F (36.8 C) (Oral)   Resp 20   LMP  (Approximate)   SpO2 98%   Visual Acuity Right Eye Distance:   Left Eye Distance:   Bilateral Distance:    Right Eye Near:   Left Eye Near:    Bilateral Near:     Physical Exam   UC Treatments / Results  Labs (all labs ordered are listed, but only abnormal results are displayed) Labs Reviewed - No data to display  EKG   Radiology US OB LESS THAN 14 WEEKS WITH OB TRANSVAGINAL  Result Date: 12/31/2020 CLINICAL DATA:  Bleeding, history of spontaneous abortion at end of October. EXAM: OBSTETRIC <14 WK [place] AND TRANSVAGINAL OB US TECHNIQUE: Both transabdominal and transvaginal ultrasound examinations were performed for complete evaluation of the gestation as well as the maternal uterus, adnexal regions, and pelvic cul-de-sac. Transvaginal technique was performed to assess early pregnancy. COMPARISON:  08/23/2013. FINDINGS: Intrauterine gestational sac: None. Yolk sac:  None. Embryo:  None. Cardiac Activity: None. Heart Rate: None. Subchorionic hemorrhage:  None visualized. Maternal uterus/adnexae: The right ovary measures 3.0 x 3.7 x 1.7 cm. The left ovary is not visualized on exam. No intrauterine gestational sac is identified. The endometrium is  not well delineated and appears heterogeneous at the fundus. IMPRESSION: 1. No evidence of intrauterine pregnancy. Correlation with beta HCG is recommended. 2. Heterogeneous endometrium, not well delineated on exam Electronically Signed   By: Thornell Sartorius M.D.   On: 12/31/2020 22:38    Procedures Procedures (including critical care time)  Medications Ordered in UC Medications - No data to display  Initial Impression / Assessment and Plan / UC Course  I have reviewed the triage vital signs and the nursing notes.  Pertinent labs & imaging results that were available during my care of the patient were reviewed by me and considered in my medical decision making (see chart for details).  Vaginal bleeding  Patient advised to go to maternity admissions unit for possible miscarriage, even though home pregnancy test negative, patient is insistent that fetal particles and tissue were expelled, discussed the capabilities of imaging and treatment in urgent care for miscarriage, unable to assess for retained tissue or complete D&C if necessary, patient to escort self, verbalized understanding Final Clinical Impressions(s) / UC Diagnoses   Final diagnoses:  Vaginal bleeding     Discharge Instructions       [address] [place], [place], [place] [address]- Maternity admissions unit    ED Prescriptions   None    PDMP not reviewed this encounter.   Valinda Hoar, NP 01/01/21 [phone]

## 2021-01-03 ENCOUNTER — Ambulatory Visit (INDEPENDENT_AMBULATORY_CARE_PROVIDER_SITE_OTHER): Payer: Self-pay

## 2021-01-03 ENCOUNTER — Other Ambulatory Visit: Payer: Self-pay

## 2021-01-03 VITALS — BP 119/79 | HR 55 | Wt 168.2 lb

## 2021-01-03 DIAGNOSIS — O3680X0 Pregnancy with inconclusive fetal viability, not applicable or unspecified: Secondary | ICD-10-CM

## 2021-01-03 LAB — BETA HCG QUANT (REF LAB): hCG Quant: 245 m[IU]/mL

## 2021-01-03 NOTE — Progress Notes (Signed)
I agree with plan of care as documented by RN. ° °Maverick Patman, MD °

## 2021-01-03 NOTE — Progress Notes (Signed)
Beta HCG Follow-up Visit  Julia Roy presents to [facility] for follow-up beta HCG lab. She was seen in [facility] for  vaginal bleeding and abdominal pain during pregnancy  on 12/31/20. Patient experienced heavy bleeding from 12/18/20-12/29/20. Patient reports vaginal spotting and continued pain today. Describes intermittent sharp pain in bilateral lower abdomen 8/10, improves with rest. Discussed with patient that we are following beta HCG levels today. Results will be back in approximately 2 hours. Valid contact number for patient confirmed. I will call the patient with results. Return precautions reviewed with patient.   Beta HCG results: 12/31/20 @ [id number]  01/03/21 @ [phone]   Results and patient history reviewed with Mathis Fare, MD, who states this appears to be a miscarriage and pt should follow up with non stat beta HCG in 1 week. Patient called and informed of plan for follow-up. Would like provider visit to discuss birth control options, message sent to front office to schedule.  Marjo Bicker 01/03/2021 8:40 AM

## 2021-01-07 ENCOUNTER — Other Ambulatory Visit: Payer: Self-pay | Admitting: *Deleted

## 2021-01-07 DIAGNOSIS — O039 Complete or unspecified spontaneous abortion without complication: Secondary | ICD-10-CM

## 2021-01-10 ENCOUNTER — Other Ambulatory Visit: Payer: No Typology Code available for payment source

## 2021-01-11 ENCOUNTER — Other Ambulatory Visit: Payer: No Typology Code available for payment source

## 2021-01-11 ENCOUNTER — Other Ambulatory Visit: Payer: Self-pay

## 2021-01-11 DIAGNOSIS — O039 Complete or unspecified spontaneous abortion without complication: Secondary | ICD-10-CM

## 2021-01-12 LAB — BETA HCG QUANT (REF LAB): hCG Quant: 33 m[IU]/mL

## 2021-01-20 ENCOUNTER — Telehealth: Payer: Self-pay | Admitting: Radiology

## 2021-01-20 NOTE — Telephone Encounter (Signed)
Left voicemail via interpreter with appointment information of 01/25/21 @ 9:00 for lab and 01/31/21 @ 9:15 with Edd Arbour at [facility]

## 2021-01-21 ENCOUNTER — Other Ambulatory Visit: Payer: Self-pay | Admitting: *Deleted

## 2021-01-21 DIAGNOSIS — O039 Complete or unspecified spontaneous abortion without complication: Secondary | ICD-10-CM

## 2021-01-23 ENCOUNTER — Other Ambulatory Visit: Payer: Self-pay

## 2021-01-23 ENCOUNTER — Ambulatory Visit: Payer: No Typology Code available for payment source | Admitting: Certified Nurse Midwife

## 2021-02-07 ENCOUNTER — Ambulatory Visit: Payer: Self-pay

## 2021-09-15 ENCOUNTER — Emergency Department: Payer: Self-pay

## 2021-09-15 ENCOUNTER — Other Ambulatory Visit: Payer: Self-pay

## 2021-09-15 ENCOUNTER — Emergency Department
Admission: EM | Admit: 2021-09-15 | Discharge: 2021-09-15 | Disposition: A | Discharge status: Home / Self Care | Payer: Self-pay | Attending: Emergency Medicine | Admitting: Emergency Medicine

## 2021-09-15 ENCOUNTER — Encounter: Payer: Self-pay | Admitting: Emergency Medicine

## 2021-09-15 DIAGNOSIS — N9489 Other specified conditions associated with female genital organs and menstrual cycle: Secondary | ICD-10-CM | POA: Insufficient documentation

## 2021-09-15 DIAGNOSIS — O26892 Other specified pregnancy related conditions, second trimester: Secondary | ICD-10-CM | POA: Insufficient documentation

## 2021-09-15 DIAGNOSIS — O021 Missed abortion: Secondary | ICD-10-CM | POA: Insufficient documentation

## 2021-09-15 DIAGNOSIS — O4690 Antepartum hemorrhage, unspecified, unspecified trimester: Secondary | ICD-10-CM

## 2021-09-15 DIAGNOSIS — Z3A20 20 weeks gestation of pregnancy: Secondary | ICD-10-CM | POA: Insufficient documentation

## 2021-09-15 DIAGNOSIS — O209 Hemorrhage in early pregnancy, unspecified: Secondary | ICD-10-CM | POA: Insufficient documentation

## 2021-09-15 LAB — CBC WITH DIFFERENTIAL/PLATELET
Abs Immature Granulocytes: 0.01 10*3/uL (ref 0.00–0.07)
Basophils Absolute: 0.1 10*3/uL (ref 0.0–0.1)
Basophils Relative: 1 %
Eosinophils Absolute: 0.1 10*3/uL (ref 0.0–0.5)
Eosinophils Relative: 2 %
HCT: 38.5 % (ref 36.0–46.0)
Hemoglobin: 12.1 g/dL (ref 12.0–15.0)
Immature Granulocytes: 0 %
Lymphocytes Relative: 42 %
Lymphs Abs: 2.8 10*3/uL (ref 0.7–4.0)
MCH: 26.5 pg (ref 26.0–34.0)
MCHC: 31.4 g/dL (ref 30.0–36.0)
MCV: 84.4 fL (ref 80.0–100.0)
Monocytes Absolute: 0.3 10*3/uL (ref 0.1–1.0)
Monocytes Relative: 4 %
Neutro Abs: 3.4 10*3/uL (ref 1.7–7.7)
Neutrophils Relative %: 51 %
Platelets: 144 10*3/uL — ABNORMAL LOW (ref 150–400)
RBC: 4.56 MIL/uL (ref 3.87–5.11)
RDW: 12.8 % (ref 11.5–15.5)
WBC: 6.7 10*3/uL (ref 4.0–10.5)
nRBC: 0 % (ref 0.0–0.2)

## 2021-09-15 LAB — COMPREHENSIVE METABOLIC PANEL
ALT: 15 U/L (ref 0–44)
AST: 20 U/L (ref 15–41)
Albumin: 3.8 g/dL (ref 3.5–5.0)
Alkaline Phosphatase: 65 U/L (ref 38–126)
Anion gap: 7 (ref 5–15)
BUN: 10 mg/dL (ref 6–20)
CO2: 24 mmol/L (ref 22–32)
Calcium: 9.1 mg/dL (ref 8.9–10.3)
Chloride: 109 mmol/L (ref 98–111)
Creatinine, Ser: 0.77 mg/dL (ref 0.44–1.00)
GFR, Estimated: >60 mL/min (ref >60)
Glucose, Bld: 98 mg/dL (ref 70–99)
Potassium: 3.6 mmol/L (ref 3.5–5.1)
Sodium: 140 mmol/L (ref 135–145)
Total Bilirubin: 0.6 mg/dL (ref 0.3–1.2)
Total Protein: 7.1 g/dL (ref 6.5–8.1)

## 2021-09-15 LAB — HCG, QUANTITATIVE, PREGNANCY: hCG, Beta Chain, Quant, S: 11294 m[IU]/mL — ABNORMAL HIGH (ref <5)

## 2021-09-15 LAB — POC URINE PREG, ED: Preg Test, Ur: POSITIVE — AB

## 2021-09-15 LAB — SAMPLE TO BLOOD BANK

## 2021-09-15 NOTE — ED Notes (Signed)
Pt to ED for dark red vaginal bleeding and pelvic pressure that started this morning. Pt is approx 10 weeks preg and has had 2 pn appts this pregnancy. Denies pain, dizziness.

## 2021-09-15 NOTE — ED Provider Triage Note (Signed)
Emergency Medicine Provider Triage Evaluation Note  Julia Roy , a 41 y.o. female  was evaluated in triage.  Pt complains of pelvic pressure and [redacted] weeks pregnant. Denies any urinary sx but did noticed blood on tissue with urination. Increase blood today.  Had miscarriage 6 months ago.  Review of Systems  Positive: Nausea, + headache Negative: No  V, D.  No fever.  Physical Exam  BP 104/75   Pulse 69   Temp 98.7 F (37.1 C) (Oral)   Resp 16   LMP  (Approximate)   SpO2 100%  Gen:   Awake, no distress   Resp:  Normal effort  MSK:   Moves extremities without difficulty  Other:    Medical Decision Making  Medically screening exam initiated at 12:26 PM.  Appropriate orders placed.  Julia Roy was informed that the remainder of the evaluation will be completed by another provider, this initial triage assessment does not replace that evaluation, and the importance of remaining in the ED until their evaluation is complete.     Julia Rumps, PA-C 09/15/21 1230

## 2021-09-15 NOTE — ED Triage Notes (Signed)
Patient to ED via POV for vaginal bleeding- pt states she had blood when she wiped yesterday morning and a moderate amount of blood this AM. Patient is [redacted] weeks pregnant and had a miscarriage approx 6 months ago.

## 2021-09-15 NOTE — ED Notes (Signed)
Pt walked to bathroom for UA. Pt reports that vaginal bldg appears to have stopped at this time. Urine appears clear.

## 2021-09-18 ENCOUNTER — Emergency Department: Payer: Self-pay

## 2021-09-18 ENCOUNTER — Other Ambulatory Visit: Payer: Self-pay

## 2021-09-18 ENCOUNTER — Encounter: Payer: Self-pay | Admitting: *Deleted

## 2021-09-18 ENCOUNTER — Emergency Department
Admission: EM | Admit: 2021-09-18 | Discharge: 2021-09-18 | Disposition: A | Discharge status: Home / Self Care | Payer: Self-pay | Attending: Emergency Medicine | Admitting: Emergency Medicine

## 2021-09-18 DIAGNOSIS — Z3A08 8 weeks gestation of pregnancy: Secondary | ICD-10-CM | POA: Insufficient documentation

## 2021-09-18 DIAGNOSIS — O039 Complete or unspecified spontaneous abortion without complication: Secondary | ICD-10-CM | POA: Insufficient documentation

## 2021-09-18 LAB — BASIC METABOLIC PANEL
Anion gap: 8 (ref 5–15)
BUN: 13 mg/dL (ref 6–20)
CO2: 19 mmol/L — ABNORMAL LOW (ref 22–32)
Calcium: 8.7 mg/dL — ABNORMAL LOW (ref 8.9–10.3)
Chloride: 109 mmol/L (ref 98–111)
Creatinine, Ser: 0.69 mg/dL (ref 0.44–1.00)
GFR, Estimated: >60 mL/min (ref >60)
Glucose, Bld: 115 mg/dL — ABNORMAL HIGH (ref 70–99)
Potassium: 3.5 mmol/L (ref 3.5–5.1)
Sodium: 136 mmol/L (ref 135–145)

## 2021-09-18 LAB — CBC WITH DIFFERENTIAL/PLATELET
Abs Immature Granulocytes: 0.02 10*3/uL (ref 0.00–0.07)
Basophils Absolute: 0 10*3/uL (ref 0.0–0.1)
Basophils Relative: 0 %
Eosinophils Absolute: 0.2 10*3/uL (ref 0.0–0.5)
Eosinophils Relative: 2 %
HCT: 36.8 % (ref 36.0–46.0)
Hemoglobin: 11.7 g/dL — ABNORMAL LOW (ref 12.0–15.0)
Immature Granulocytes: 0 %
Lymphocytes Relative: 28 %
Lymphs Abs: 2.4 10*3/uL (ref 0.7–4.0)
MCH: 27 pg (ref 26.0–34.0)
MCHC: 31.8 g/dL (ref 30.0–36.0)
MCV: 84.8 fL (ref 80.0–100.0)
Monocytes Absolute: 0.5 10*3/uL (ref 0.1–1.0)
Monocytes Relative: 5 %
Neutro Abs: 5.5 10*3/uL (ref 1.7–7.7)
Neutrophils Relative %: 65 %
Platelets: 119 10*3/uL — ABNORMAL LOW (ref 150–400)
RBC: 4.34 MIL/uL (ref 3.87–5.11)
RDW: 12.8 % (ref 11.5–15.5)
WBC: 8.6 10*3/uL (ref 4.0–10.5)
nRBC: 0 % (ref 0.0–0.2)

## 2021-09-18 LAB — HCG, QUANTITATIVE, PREGNANCY: hCG, Beta Chain, Quant, S: 3537 m[IU]/mL — ABNORMAL HIGH (ref <5)

## 2021-09-18 NOTE — ED Triage Notes (Signed)
Pt had a miscarriage this week.  Pt reports abd pain ,cramping and vag bleeding.  Pt was approx [redacted] weeks pregnant.  Pt alert.

## 2021-09-18 NOTE — ED Provider Notes (Signed)
[facility] Provider Note    Event Date/Time   First MD Initiated Contact with Patient 09/18/21 1702     (approximate)  History   Chief Complaint: Abdominal Pain and Vaginal Bleeding  HPI  Julia Roy is a 41 y.o. female with a past medical history anxiety, hyperlipidemia undergoing a miscarriage who presents to the emergency department for worsening abdominal pain and continued bleeding.  Patient was seen in the emergency department 3 days ago for an ongoing miscarriage in progress.  At that time patient had a 8-week ultrasound but no fetal pole consistent with miscarriage.  Patient states the pain worsened significantly so she came to the emergency department.  Shortly after arriving to the emergency department patient passed a large clot and possibly tissue.  She states since that time she has not had any further abdominal pain.  Physical Exam   Triage Vital Signs: ED Triage Vitals  Enc Vitals Group     BP 09/18/21 1607 (!) 109/58     Pulse Rate 09/18/21 1607 68     Resp 09/18/21 1607 17     Temp 09/18/21 1607 98.6 F (37 C)     Temp Source 09/18/21 1607 Oral     SpO2 09/18/21 1607 98 %     Weight 09/18/21 1607 174 lb 2.6 oz (79 kg)     Height 09/18/21 1607 5\' 1"  (1.549 m)     Head Circumference --      Peak Flow --      Pain Score 09/18/21 1607 10     Pain Loc --      Pain Edu? --      Excl. in GC? --     Most recent vital signs: Vitals:   09/18/21 1607  BP: (!) 109/58  Pulse: 68  Resp: 17  Temp: 98.6 F (37 C)  SpO2: 98%    General: Awake, no distress.  CV:  Good peripheral perfusion.  Regular rate and rhythm  Resp:  Normal effort.  Equal breath sounds bilaterally.  Abd:  No distention.  Soft, mild lower abdominal tenderness to palpation no rebound or guarding.   ED Results / Procedures / Treatments   RADIOLOGY  Patient's ultrasound shows a completed miscarriage.  No products of conception noted.   MEDICATIONS  ORDERED IN ED: Medications - No data to display   IMPRESSION / MDM / ASSESSMENT AND PLAN / ED COURSE  I reviewed the triage vital signs and the nursing notes.  Patient's presentation is most consistent with acute presentation with potential threat to life or bodily function.  Patient presents emergency department for worsening lower abdominal pain.  I reviewed the patient's chart from 3 days ago she had an 8-week fetus on ultrasound with no fetal cardiac activity.  Patient states shortly after arrival to the emergency department she passed a large clot with tissue she took a picture of which and showed it to myself it appears to have both blood clot and tissue within the picture.  She states after this the pain went away and she is now nearly pain-free just mild residual discomfort.  Patient's lab work today shows a decreasing beta hCG.  Reassuring chemistry and reassuring CBC with a reassuring hemoglobin which is largely unchanged from past values.  Patient's ultrasound completed here today after the patient passed blood clot/products shows a completed miscarriage.  Patient denies any significant discomfort currently.  Suspect the patient has completed her miscarriage.  We will discharge with  supportive care Tylenol or ibuprofen as needed for discomfort.  Discussed return precautions as well as OB follow-up.  Patient agreeable to plan of care.  FINAL CLINICAL IMPRESSION(S) / ED DIAGNOSES   Completed miscarriage   Note:  This document was prepared using Dragon voice recognition software and may include unintentional dictation errors.   Minna Antis, MD 09/18/21 1718

## 2021-09-18 NOTE — ED Provider Triage Note (Signed)
Emergency Medicine Provider Triage Evaluation Note  Julia Roy, a 41 y.o. female Julia Roy was evaluated in triage.  Pt complains of vaginal bleeding and likely spontaneous miscarriage.  Patient was seen in this ED 2 days prior, without ultrasound evidence of fetal heart tones, and findings concerning for IUFD.  Patient presents to the ED today complaining of active bleeding, abdominal pain, and cramping.  Review of Systems  Positive: Vag bleeding  Negative: FCS  Physical Exam  BP (!) 109/58 (BP Location: Left Arm)   Pulse 68   Temp 98.6 F (37 C) (Oral)   Resp 17   Ht 5\' 1"  (1.549 m)   Wt 79 kg   LMP  (Approximate)   SpO2 98%   Breastfeeding No   BMI 32.91 kg/m  Gen:   Awake, no distress  NAD Resp:  Normal effort  MSK:   Moves extremities without difficulty  Other:    Medical Decision Making  Medically screening exam initiated at 4:12 PM.  Appropriate orders placed.  Julia Roy was informed that the remainder of the evaluation will be completed by another provider, this initial triage assessment does not replace that evaluation, and the importance of remaining in the ED until their evaluation is complete.  Patient to the ED for onset of vaginal bleeding presumed to be spontaneous miscarriage.  She reports pelvic pain and abdominal cramping.   Madelaine Bhat, PA-C 09/18/21 [phone]

## 2021-09-30 NOTE — ED Provider Notes (Signed)
[facility] Provider Note   Event Date/Time   First MD Initiated Contact with Patient 09/15/21 1257     (approximate) History  Vaginal Bleeding  HPI Julia Roy is a 41 y.o. female with a stated past medical history of 10 weeks pregnancy as well as recent miscarriage 6 months prior to arrival who presents for vaginal bleeding that she found when she was to the bathroom this morning and was wiping finding on the toilet paper.  Patient states that it looked like "less than a shot glass worth" but she is concerned with her history of miscarriage that this may be happening again.  Patient does endorse mild suprapubic abdominal cramping. ROS: Patient currently denies any vision changes, tinnitus, difficulty speaking, facial droop, sore throat, chest pain, shortness of breath,  nausea/vomiting/diarrhea, dysuria, or weakness/numbness/paresthesias in any extremity   Physical Exam  Triage Vital Signs: ED Triage Vitals  Enc Vitals Group     BP 09/15/21 1220 104/75     Pulse Rate 09/15/21 1220 69     Resp 09/15/21 1220 16     Temp 09/15/21 1220 98.7 F (37.1 C)     Temp Source 09/15/21 1220 Oral     SpO2 09/15/21 1220 100 %     Weight 09/15/21 1228 176 lb (79.8 kg)     Height 09/15/21 1228 5\' 1"  (1.549 m)     Head Circumference --      Peak Flow --      Pain Score 09/15/21 1228 0     Pain Loc --      Pain Edu? --      Excl. in GC? --    Most recent vital signs: Vitals:   09/15/21 1354 09/15/21 1640  BP: 110/70 112/73  Pulse: 70 72  Resp: 16 16  Temp: 98 F (36.7 C)   SpO2: 100% 100%   General: Awake, oriented x4. CV:  Good peripheral perfusion.  Resp:  Normal effort.  Abd:  No distention.  Mild suprapubic abdominal tenderness to palpation Other:  Middle-aged Hispanic female laying in bed in no acute distress ED Results / Procedures / Treatments  Labs (all labs ordered are listed, but only abnormal results are displayed) Labs Reviewed  CBC  WITH DIFFERENTIAL/PLATELET - Abnormal; Notable for the following components:      Result Value   Platelets 144 (*)    All other components within normal limits  HCG, QUANTITATIVE, PREGNANCY - Abnormal; Notable for the following components:   hCG, Beta Chain, Quant, S 11,294 (*)    All other components within normal limits  POC URINE PREG, ED - Abnormal; Notable for the following components:   Preg Test, Ur POSITIVE (*)    All other components within normal limits  COMPREHENSIVE METABOLIC PANEL  SAMPLE TO BLOOD BANK   RADIOLOGY ED MD interpretation: Transvaginal ultrasound ordered by me and shows a fetal pole without visualized yolk sac.  Given crown-rump length of greater than 7 mm without any evidence of of fetal heart motion, there is high probability that patient has had a pregnancy failure -Agree with radiology assessment Official radiology report(s): No results found. PROCEDURES: Critical Care performed: No Procedures MEDICATIONS ORDERED IN ED: Medications - No data to display IMPRESSION / MDM / ASSESSMENT AND PLAN / ED COURSE  I reviewed the triage vital signs and the nursing notes.  Differential diagnosis includes, but is not limited to, first trimester bleeding, placental abruption, placenta accreta, miscarriage The patient is on the cardiac monitor to evaluate for evidence of arrhythmia and/or significant heart rate changes. Patient's presentation is most consistent with acute presentation with potential threat to life or bodily function. Patient is a 41 year old female that presents for vaginal bleeding in the setting of positive pregnancy test and assumed [redacted] weeks gestation Workup: CBC, BMP, UA, bHCG, Type&Screen, 1st Trimester Ultrasound  Based on History, Exam, and ED Workup patients presentation not consistent with ectopic pregnancy, molar pregnancy, life-threatening coagulopathy, trauma, serious bacterial infection, central process or other  emergency. Transvaginal ultrasound does show likely failed pregnancy and I informed patient's as well as provided counseling.  Patient was informed on what to expect given this inevitable abortion as well as instructions to follow-up with OB/GYN if she does not have passage of all fetal products. Disposition: Will discharge home with return precautions and instruction for prompt OBGYN follow up.   FINAL CLINICAL IMPRESSION(S) / ED DIAGNOSES   Final diagnoses:  Vaginal bleeding in pregnancy  Missed abortion with fetal demise before 20 completed weeks of gestation   Rx / DC Orders   ED Discharge Orders     None      Note:  This document was prepared using Dragon voice recognition software and may include unintentional dictation errors.   Merwyn Katos, MD 09/30/21 [phone]

## 2022-07-07 ENCOUNTER — Ambulatory Visit: Payer: Self-pay | Admitting: Physician Assistant

## 2022-12-20 IMAGING — MG DIGITAL DIAGNOSTIC BILAT W/ TOMO W/ CAD
8 series · 8 of 24 positions shown · non-contrast
Comparison: None.

CLINICAL DATA: 40-year-old female presenting for evaluation of
bilateral cramping breast pains for over 1 year.

EXAM:
DIGITAL DIAGNOSTIC BILATERAL MAMMOGRAM WITH TOMOSYNTHESIS AND CAD
TECHNIQUE: Bilateral digital diagnostic mammography and breast tomosynthesis
was performed. The images were evaluated with computer-aided
detection.

[R MLO synth-2D]
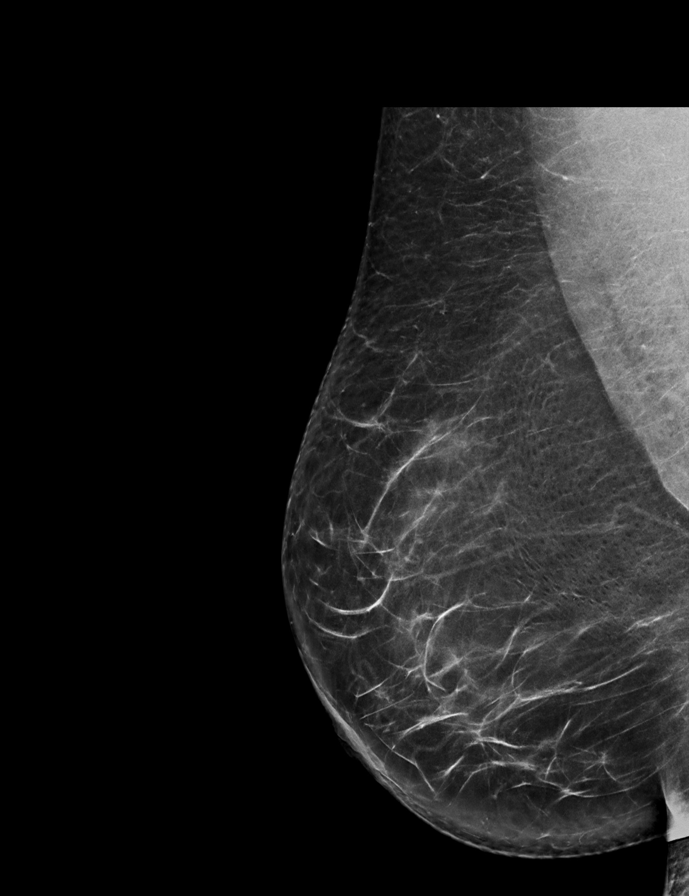

[L CC synth-2D]
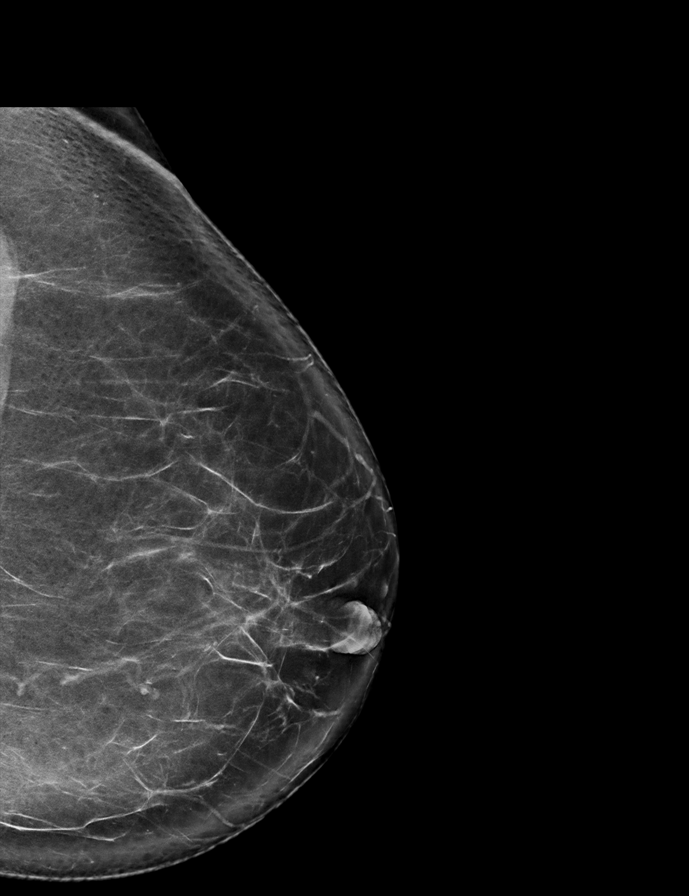

[L MLO synth-2D]
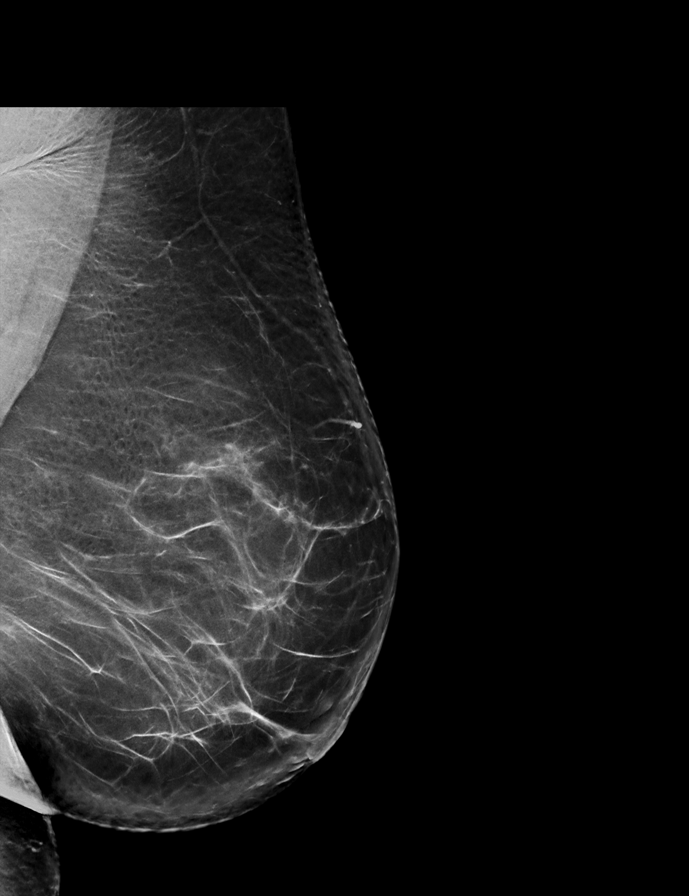

[R CC synth-2D]
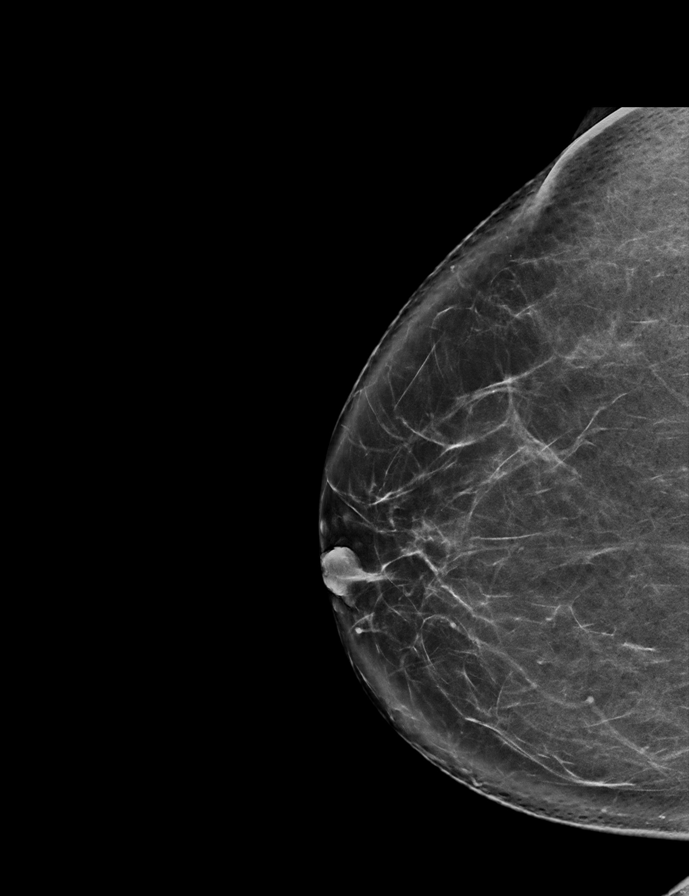

[R MLO tomo · tomo slice 41/81.0]
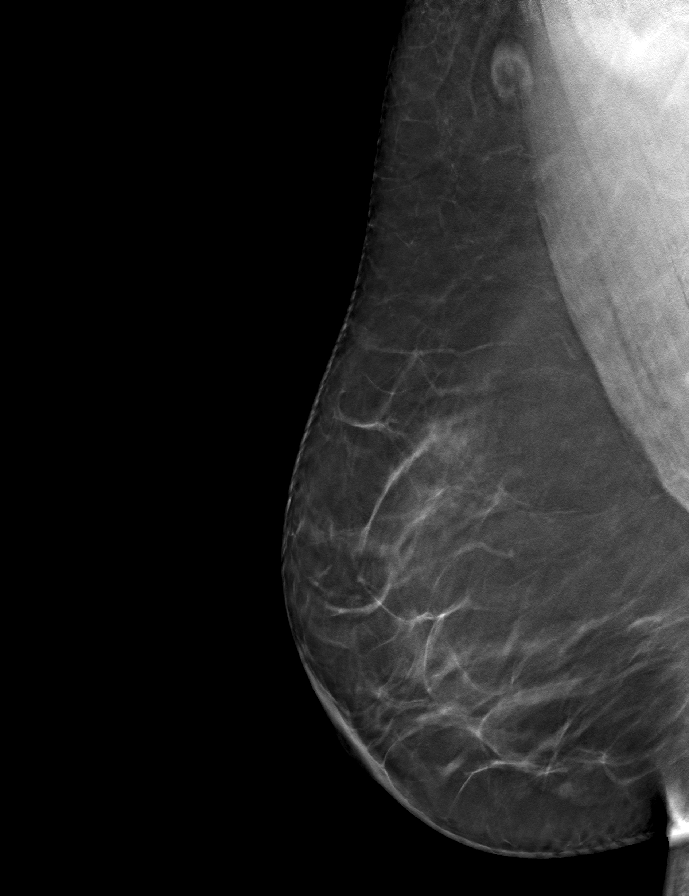

[L MLO tomo · tomo slice 44/87.0]
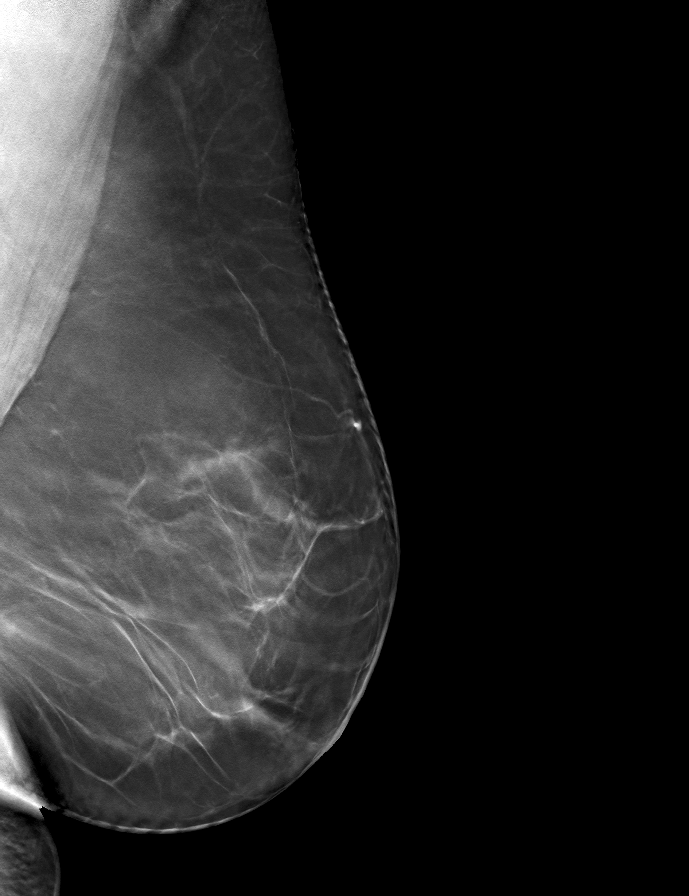

[L CC tomo · tomo slice 41/81.0]
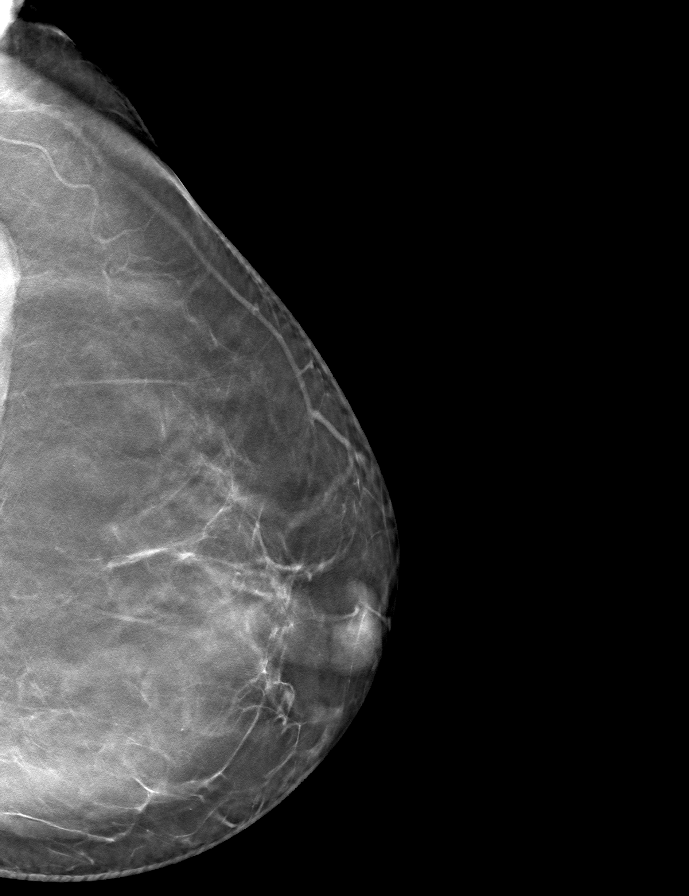

[R CC tomo · tomo slice 39/78.0]
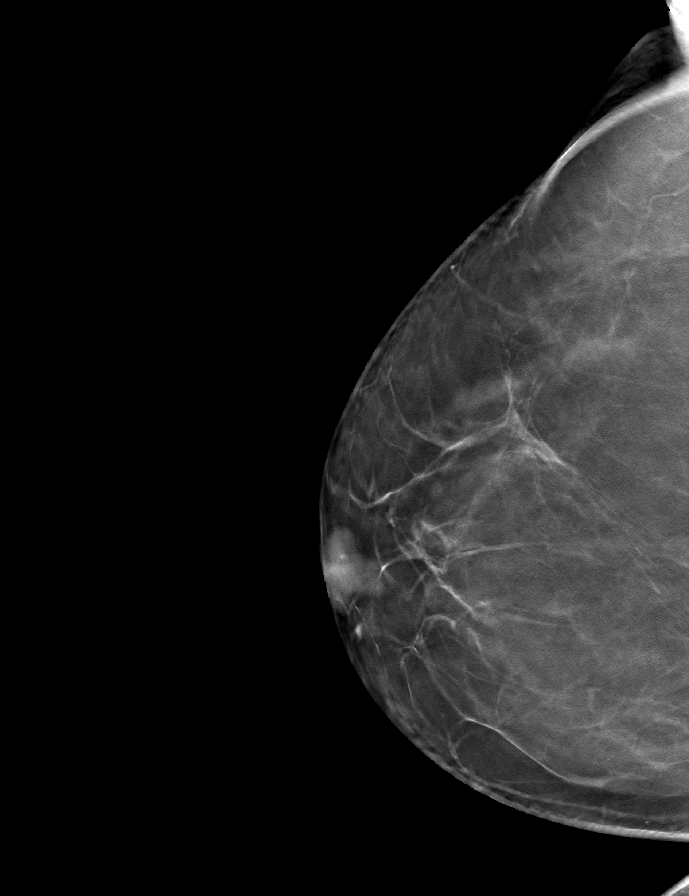

[8 of 24 positions shown; findings below may reference images not displayed]

ACR Breast Density Category b: There are scattered areas of
fibroglandular density.
FINDINGS: No suspicious calcifications, masses or areas of distortion are seen
in the bilateral breasts.
IMPRESSION: 1. No findings in either breast to explain the patient's nonfocal
breast pain.

2.  No mammographic evidence of malignancy in the bilateral breasts.

RECOMMENDATION:
1. Breast pain is a common condition, which will often resolve on
its own without intervention. It can be affected by hormonal
changes, medication side effect, weight changes and fit of the bra.
Pain may also be referred from other adjacent areas of the body.
Breast pain may be improved by wearing adequate well-fitting
support, over-the-counter topical and oral NSAID medication, low-fat
diet, and ice/heat as needed. Studies have shown an improvement in
cyclic pain with use of evening primrose oil and vitamin E. Clinical
follow-up recommended to discuss any further work-up recommendations
and appropriate treatment.

2.  Screening mammogram in one year.(Code:98-T-CUA)

I have discussed the findings and recommendations with the patient.
If applicable, a reminder letter will be sent to the patient
regarding the next appointment.

BI-RADS CATEGORY  1: Negative.

## 2022-12-31 IMAGING — US US OB < 14 WEEKS - US OB TV
1 series · 15 of 28 positions shown · non-contrast
Comparison: 08/23/2013.

CLINICAL DATA: Bleeding, history of spontaneous abortion at end [DATE].

EXAM:
OBSTETRIC <14 WK US AND TRANSVAGINAL OB US
TECHNIQUE: Both transabdominal and transvaginal ultrasound examinations were
performed for complete evaluation of the gestation as well as the
maternal uterus, adnexal regions, and pelvic cul-de-sac.
Transvaginal technique was performed to assess early pregnancy.

[Series 1: us ob < 14 weeks - us ob tv · 51 acquisitions, 15 frames shown]
[im 1/51]
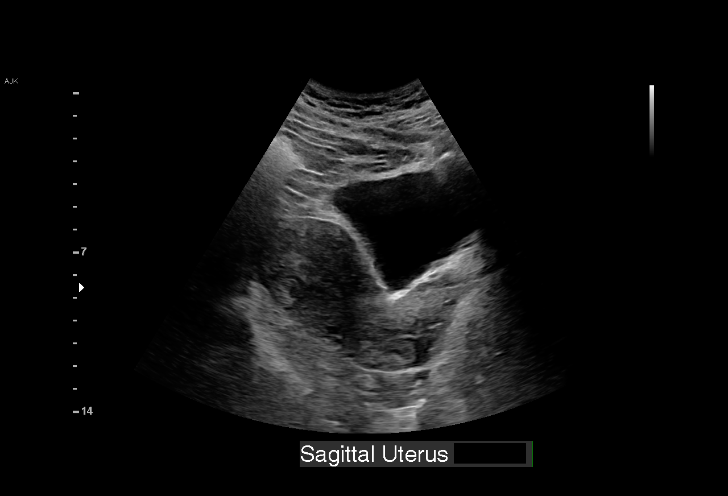
[im 4/51]
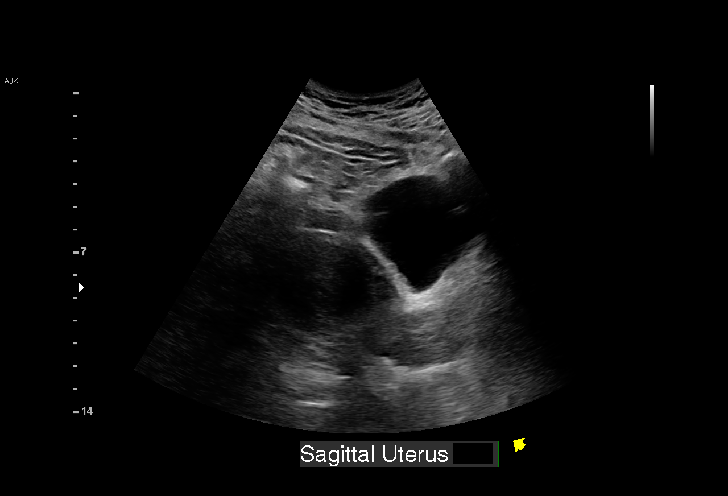
[im 8/51]
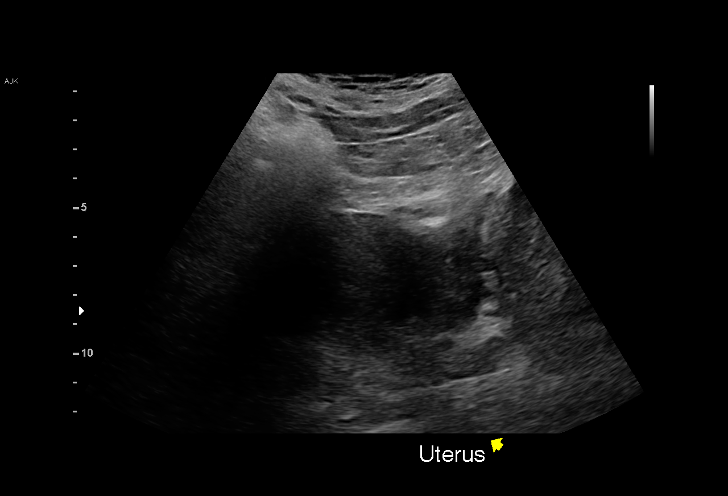
[im 12/51]
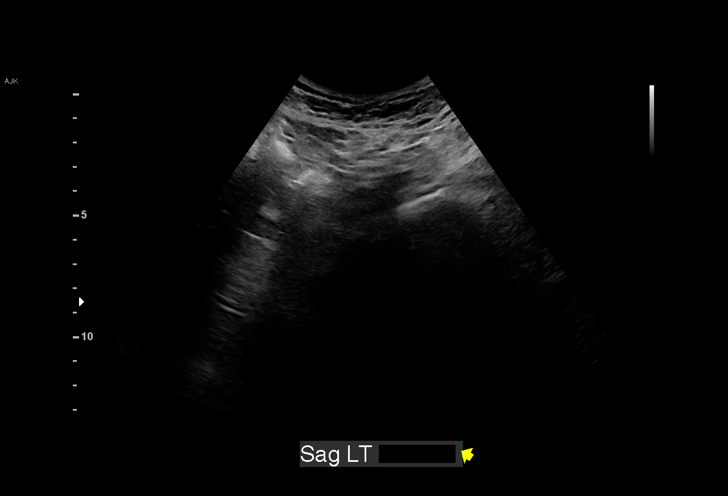
[im 15/51]
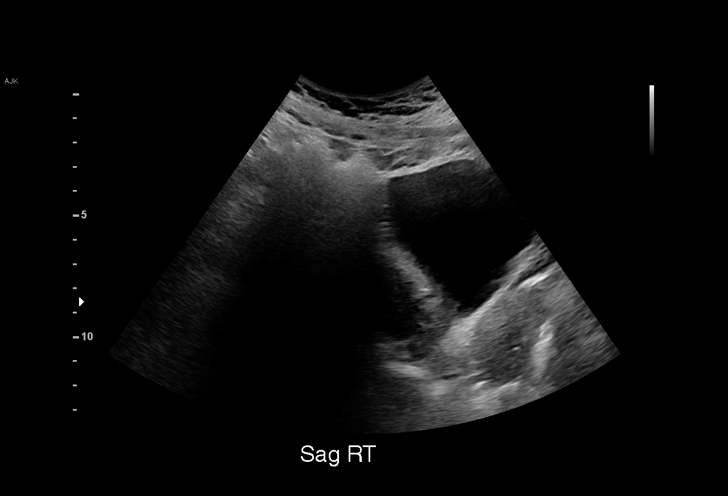
[im 19/51]
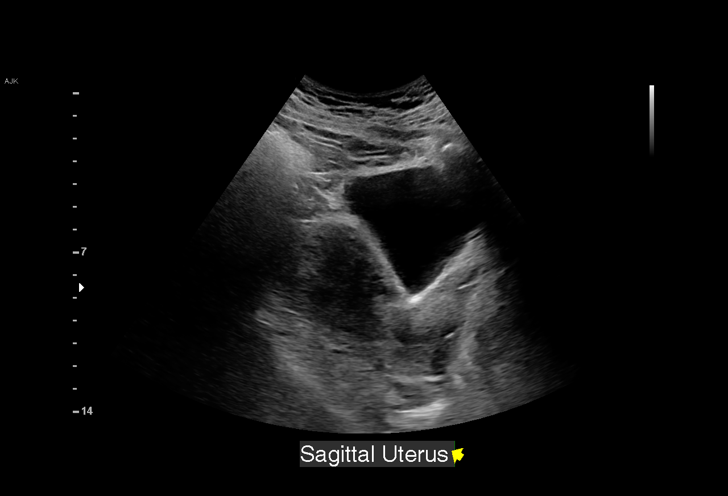
[im 23/51]
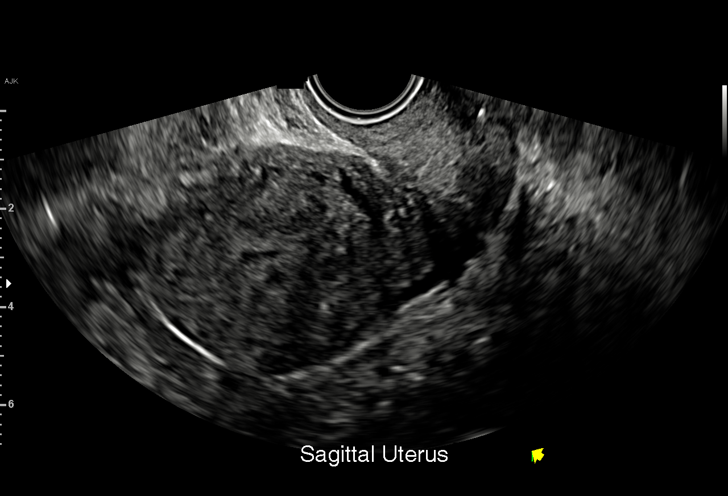
[im 26/51]
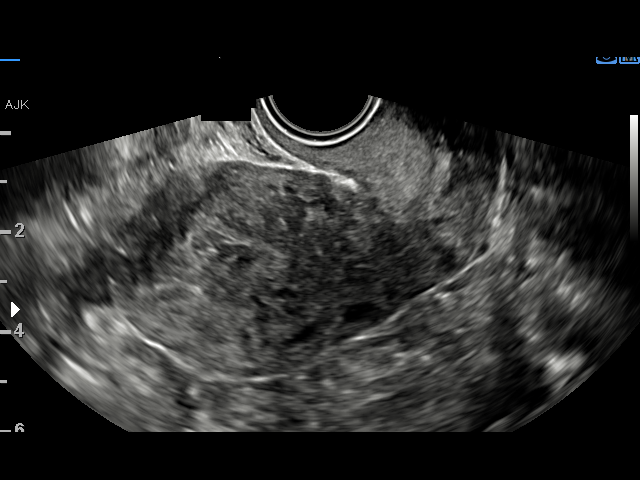
[im 28/51]
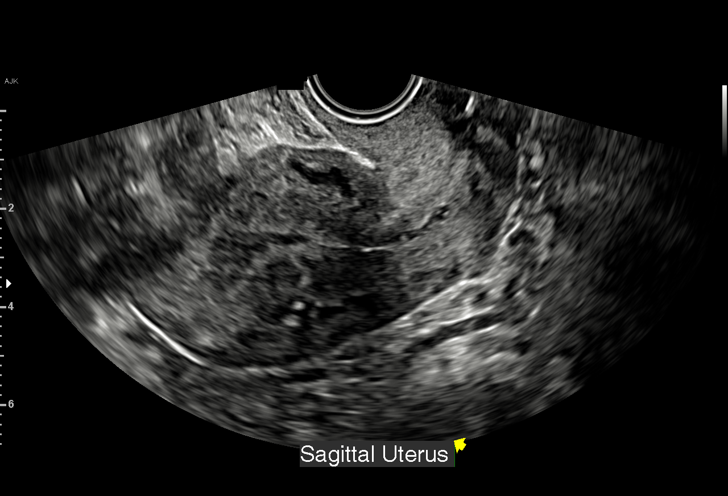
[im 32/51]
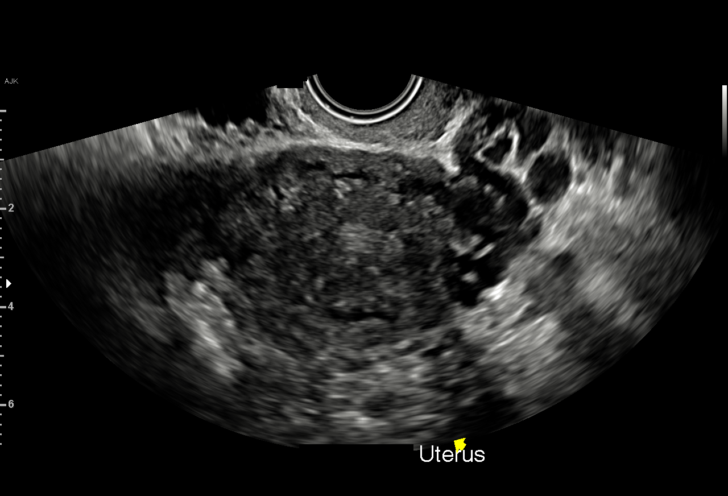
[im 36/51]
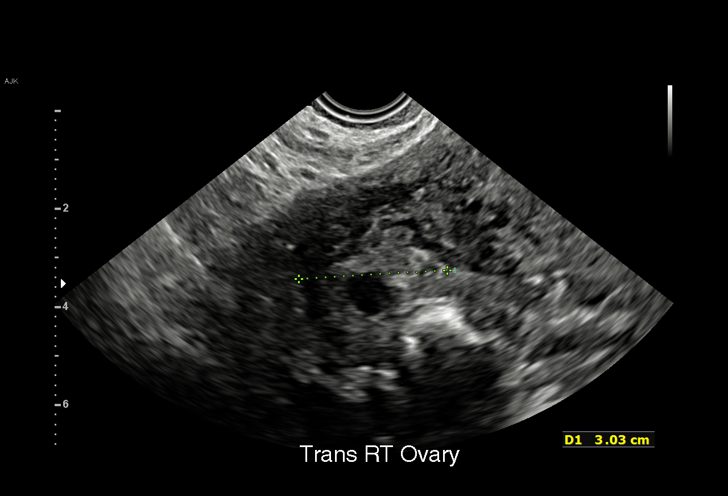
[im 39/51]
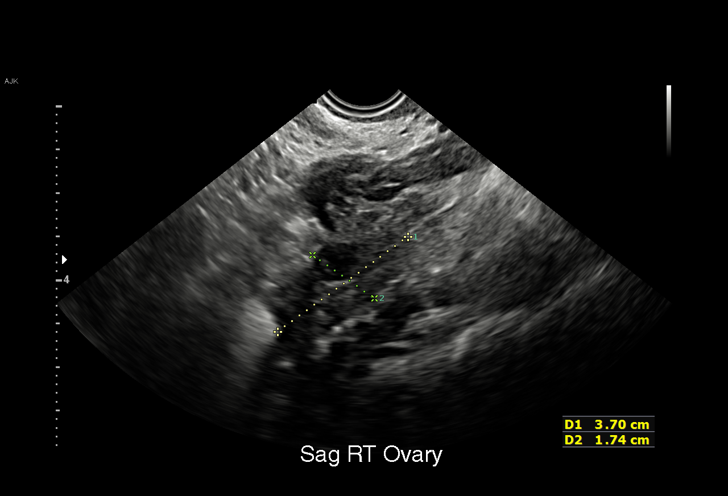
[im 43/51]
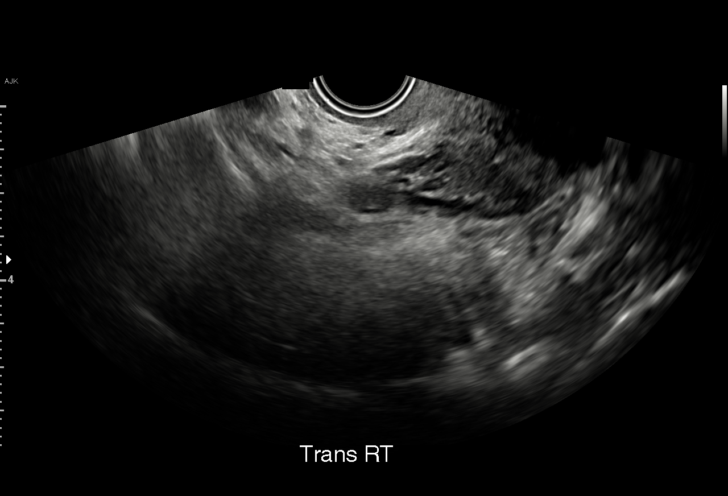
[im 47/51]
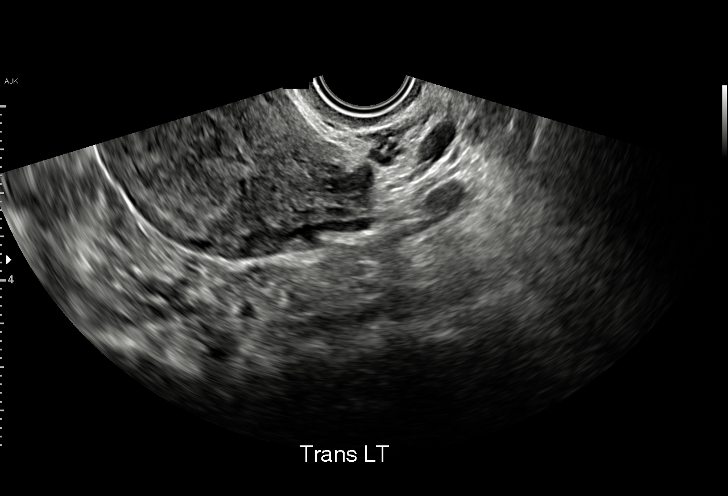
[im 51/51]
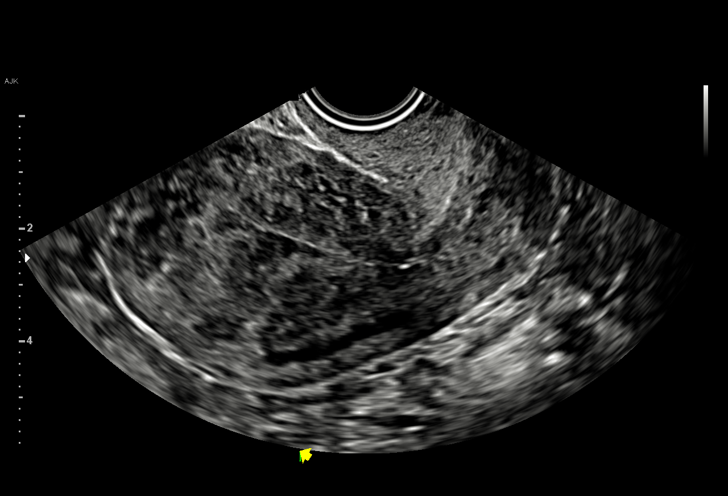

[15 of 28 positions shown; findings below may reference images not displayed]

FINDINGS: Intrauterine gestational sac: None.

Yolk sac:  None.

Embryo:  None.

Cardiac Activity: None.

Heart Rate: None.

Subchorionic hemorrhage:  None visualized.

Maternal uterus/adnexae: The right ovary measures 3.0 x 3.7 x
cm. The left ovary is not visualized on exam. No intrauterine
gestational sac is identified. The endometrium is not well
delineated and appears heterogeneous at the fundus.
IMPRESSION: 1. No evidence of intrauterine pregnancy. Correlation with beta HCG
is recommended.
2. Heterogeneous endometrium, not well delineated on exam
